# Patient Record
Sex: Female | Born: 1960 | Race: White | Hispanic: No | Smoking: Former smoker
Health system: Southern US, Community
[De-identification: ages and names within clinical notes are randomized; demographics above are authoritative.]

## PROBLEM LIST (undated history)

## (undated) DIAGNOSIS — C50919 Malignant neoplasm of unspecified site of unspecified female breast: Secondary | ICD-10-CM

## (undated) DIAGNOSIS — F419 Anxiety disorder, unspecified: Secondary | ICD-10-CM

## (undated) DIAGNOSIS — R519 Headache, unspecified: Secondary | ICD-10-CM

## (undated) DIAGNOSIS — K219 Gastro-esophageal reflux disease without esophagitis: Secondary | ICD-10-CM

## (undated) DIAGNOSIS — Z923 Personal history of irradiation: Secondary | ICD-10-CM

## (undated) HISTORY — DX: Malignant neoplasm of unspecified site of unspecified female breast: C50.919

## (undated) HISTORY — PX: ROTATOR CUFF REPAIR: SHX139

## (undated) HISTORY — PX: CHOLECYSTECTOMY: SHX55

## (undated) HISTORY — DX: Anxiety disorder, unspecified: F41.9

---

## 1989-09-02 HISTORY — PX: PARTIAL HYSTERECTOMY: SHX80

## 2004-09-02 DIAGNOSIS — C50919 Malignant neoplasm of unspecified site of unspecified female breast: Secondary | ICD-10-CM

## 2004-09-02 HISTORY — PX: BREAST LUMPECTOMY: SHX2

## 2004-09-02 HISTORY — DX: Malignant neoplasm of unspecified site of unspecified female breast: C50.919

## 2004-09-02 HISTORY — PX: MASTECTOMY PARTIAL / LUMPECTOMY: SUR851

## 2005-05-22 DIAGNOSIS — C50919 Malignant neoplasm of unspecified site of unspecified female breast: Secondary | ICD-10-CM | POA: Insufficient documentation

## 2008-09-02 DIAGNOSIS — I671 Cerebral aneurysm, nonruptured: Secondary | ICD-10-CM

## 2008-09-02 HISTORY — DX: Cerebral aneurysm, nonruptured: I67.1

## 2008-09-02 HISTORY — PX: ANEURYSM COILING: SHX5349

## 2008-09-02 HISTORY — PX: OTHER SURGICAL HISTORY: SHX169

## 2012-04-29 ENCOUNTER — Telehealth: Payer: Self-pay | Admitting: *Deleted

## 2012-04-29 NOTE — Telephone Encounter (Signed)
Confirmed 05/22/12 appt w/ pt.  Wandra Feinstein at referring to make her aware.  Mailed before appt letter & packet to pt.  Took paperwork to Med [facility] for chart.

## 2012-05-14 ENCOUNTER — Other Ambulatory Visit: Payer: Self-pay | Admitting: *Deleted

## 2012-05-14 DIAGNOSIS — C50919 Malignant neoplasm of unspecified site of unspecified female breast: Secondary | ICD-10-CM

## 2012-05-22 ENCOUNTER — Ambulatory Visit: Payer: Self-pay

## 2012-05-22 ENCOUNTER — Telehealth: Payer: Self-pay | Admitting: *Deleted

## 2012-05-22 ENCOUNTER — Other Ambulatory Visit (HOSPITAL_BASED_OUTPATIENT_CLINIC_OR_DEPARTMENT_OTHER): Payer: Medicare Other | Admitting: Lab

## 2012-05-22 ENCOUNTER — Ambulatory Visit (HOSPITAL_BASED_OUTPATIENT_CLINIC_OR_DEPARTMENT_OTHER): Payer: Medicare Other | Admitting: Oncology

## 2012-05-22 ENCOUNTER — Encounter: Payer: Self-pay | Admitting: Oncology

## 2012-05-22 VITALS — BP 144/92 | HR 97 | Temp 98.6°F | Resp 20 | Ht 64.0 in | Wt 201.6 lb

## 2012-05-22 DIAGNOSIS — Z170 Estrogen receptor positive status [ER+]: Secondary | ICD-10-CM

## 2012-05-22 DIAGNOSIS — C50919 Malignant neoplasm of unspecified site of unspecified female breast: Secondary | ICD-10-CM

## 2012-05-22 DIAGNOSIS — R413 Other amnesia: Secondary | ICD-10-CM

## 2012-05-22 LAB — COMPREHENSIVE METABOLIC PANEL (CC13)
ALT: 55 U/L (ref 0–55)
AST: 38 U/L — ABNORMAL HIGH (ref 5–34)
Albumin: 4.4 g/dL (ref 3.5–5.0)
Alkaline Phosphatase: 104 U/L (ref 40–150)
BUN: 10 mg/dL (ref 7.0–26.0)
CO2: 27 mEq/L (ref 22–29)
Calcium: 10.3 mg/dL (ref 8.4–10.4)
Chloride: 102 mEq/L (ref 98–107)
Creatinine: 0.9 mg/dL (ref 0.6–1.1)
Glucose: 93 mg/dl (ref 70–99)
Potassium: 4.3 mEq/L (ref 3.5–5.1)
Sodium: 139 mEq/L (ref 136–145)
Total Bilirubin: 0.6 mg/dL (ref 0.20–1.20)
Total Protein: 7.2 g/dL (ref 6.4–8.3)

## 2012-05-22 LAB — CBC WITH DIFFERENTIAL/PLATELET
BASO%: 0.4 % (ref 0.0–2.0)
Basophils Absolute: 0 10*3/uL (ref 0.0–0.1)
EOS%: 2.1 % (ref 0.0–7.0)
Eosinophils Absolute: 0.1 10*3/uL (ref 0.0–0.5)
HCT: 41.1 % (ref 34.8–46.6)
HGB: 13.9 g/dL (ref 11.6–15.9)
LYMPH%: 45 % (ref 14.0–49.7)
MCH: 32.5 pg (ref 25.1–34.0)
MCHC: 33.9 g/dL (ref 31.5–36.0)
MCV: 95.7 fL (ref 79.5–101.0)
MONO#: 0.5 10*3/uL (ref 0.1–0.9)
MONO%: 8.1 % (ref 0.0–14.0)
NEUT#: 2.9 10*3/uL (ref 1.5–6.5)
NEUT%: 44.4 % (ref 38.4–76.8)
Platelets: 225 10*3/uL (ref 145–400)
RBC: 4.29 10*6/uL (ref 3.70–5.45)
RDW: 12.1 % (ref 11.2–14.5)
WBC: 6.6 10*3/uL (ref 3.9–10.3)
lymph#: 3 10*3/uL (ref 0.9–3.3)

## 2012-05-22 NOTE — Patient Instructions (Addendum)
Refer to Breast center for annual mammogram  Refer to Maylon Cos pt with early onset breast cancer  Breast Cancer Survivor Follow-Up Breast cancer begins when cells in the breast divide too rapidly. The extra cells form a lump (tumor). When the cancer is treated, the goal is to get rid of all cancer cells. However, sometimes a few cells survive. These cancer cells can then grow. They become recurrent cancer. This means the cancer comes back after treatment.  Most cases of recurrent breast cancer develop 3 to 5 years after treatment. However, sometimes it comes back just a few months after treatment. Other times, it does not come back until years later. If the cancer comes back in the same area as the first breast cancer, it is called a local recurrence. If the cancer comes back somewhere else in the body, it is called regional recurrence if the site is fairly near the breast or distant recurrence if it is far from the breast. Your caregiver may also use the term metastasize to indicate a cancer that has gone to another part of your body. Treatment is still possible after either kind of recurrence. The cancer can still be controlled.  CAUSES OF RECURRENT CANCER No one knows exactly why breast cancer starts in the first place. Why the cancer comes back after treatment is also not clear. It is known that certain conditions, called risk factors, can make this more likely. They include:  Developing breast cancer for the first time before age 16.   Having breast cancer that involves the lymph nodes. These are small, round pieces of tissue found all over the body. Their job is to help fight infections.   Having a large tumor. Cancer is more apt to come back if the first tumor was bigger than 2 inches (5 cm).   Having certain types of breast cancer, such as:   Inflammatory breast cancer. This rare type grows rapidly and causes the breast to become red and swollen.   A high-grade tumor. The grade of a  tumor indicates how fast it will grow and spread. High-grade tumors grow more quickly than other types.   HER2 cancer. This refers to the tumor's genetic makeup. Tumors that have this type of gene are more likely to come back after treatment.   Having close tumor margins. This refers to the space between the tumor and normal, noncancerous cells. If the space is small, the tumor has a greater chance of coming back.   Having treatment involving a surgery to remove the tumor but not the entire breast (lumpectomy) and no radiation therapy.  CARE AFTER BREAST CANCER Home Monitoring Women who have had breast cancer should continue to examine their breasts every month. The goal is to catch the cancer quickly if it comes back. Many women find it helpful to do so on the same day each month and to mark the calendar as a reminder. Let your caregiver know immediately if you have any signs of recurrent breast cancer. Symptoms will vary, depending on where the cancer recurs. The original type of treatment can also make a difference. Symptoms of local recurrence after a lumpectomy or a recurrence in the opposite breast may include:  A new lump or thickening in the breast.   A change in the way the skin looks on the breast (such as a rash, dimpling, or wrinkling).   Redness or swelling of the breast.   Changes in the nipple (such as being red, puckered, swollen,  or leaking fluid).  Symptoms of a recurrence after a breast removal surgery (mastectomy) may include:  A lump or thickening under the skin.   A thickening around the mastectomy scar.  Symptoms of regional recurrence in the lymph nodes near the breast may include:  A lump under the arm or above the collarbone.   Swelling of the arm.   Pain in the arm, shoulder, or chest.   Numbness in the hand or arm.  Symptoms of distant recurrence may include:  A cough that does not go away.   Trouble breathing or shortness of breath.   Pain in the  bones or the chest. This is pain that lasts or does not respond to rest and medicine.   Headaches.   Sudden vision problems.   Dizziness.   Nausea or vomiting.   Losing weight without trying to.   Persistent abdominal pain.   Changes in bowel movements or blood in the stool.   Yellowing of the skin or eyes (jaundice).   Blood in the urine or bloody vaginal discharge.  Clinical Monitoring  It is helpful to keep a schedule of appointments for needed tests and exams. This includes physical exams, breast exams, exams of the lymph nodes, and general exams.   For the first 3 years after being treated for breast cancer, see your caregiver every 3 to 6 months.   For years 4 and 5 after breast cancer, see your caregiver every 6 to 12 months.   After 5 years, see your caregiver at least once a year.   Regular breast X-rays (mammograms) should continue even if you had a mastectomy.   A mammogram should be done 1 year after the mammogram that first detected breast cancer.   A mammogram should be done every 6 to 12 months after that. Follow your caregiver's advice.   A pelvic exam done by your caregiver checks whether female organs are the normal size and shape. The exam is usually done every year. Ask your caregiver if that schedule is right for you.   Women taking tamoxifen should report any vaginal bleeding immediately to their caregiver. Tamoxifen is often given to women with a certain type of breast cancer. It has been shown to help prevent recurrence.   You will need to decide who your primary caregiver will be.   Most people continue to see their cancer specialist (oncologist) every 3 to 6 months for the first year after cancer treatment.   At some point, you may want to go back to seeing your family caregiver. You would no longer see your oncologist for regular checkups. Many women do this about 1 year after their first diagnosis of breast cancer.   You will still need to be  seen every so often by your oncologist. Ask how often that should be. Coordinate this with your family or primary caregiver.   Think about having genetic counseling. This would provide information on traits that can be passed or inherited from one generation to the next. In some cases, breast cancer runs in families. Tell your caregiver if you:   Are of Ashkenazi Jewish heritage.   Have any family member who has had ovarian cancer.   Have a mother, sister, or daughter who had breast cancer before age 28.   Have 2 or more close relatives who have had breast cancer. This means a mother, sister, daughter, aunt, or grandmother.   Had breast cancer in both breasts.   Have a female relative  who has had breast cancer.   Some tests are not recommended for routine screening. Someone recovering from breast cancer does not need to have these tests if there are no problems. The tests have risks, such as radiation exposure, and can be costly. The risks of these tests are thought to be greater than the benefits:   Blood tests.   Chest X-rays.   Bone scans.   Liver ultrasound.   Computed tomography (CT scan).   Positron emission tomography (PET scan).   Magnetic resonance imaging (MRI scan).  DIAGNOSIS OF RECURRENT CANCER Recurrent breast cancer may be suspected for various reasons. A mammogram may not look normal. You might feel a lump or have other symptoms. Your caregiver may find something unusual during an exam. To be sure, your caregiver will probably order some tests. The tests are needed because there are symptoms or hints of a problem. They could include:  Blood tests, including a test to check how well the liver is working. The liver is a common site for a distant cancer recurrence.   Imaging tests that create pictures of the inside of the body. These tests include:   Chest X-rays to show if the cancer has come back in the lungs.   CT scans to create detailed pictures of various  areas of the body and help find a distant recurrence.   MRI scans to find anything unusual in the breast, chest, or lymph nodes.   Breast ultrasound tests to examine the breasts.   Bone scans to create a picture of your whole skeleton and find cancer in bony areas.   PET scans to create an image of the whole body. PET scans can be used together with CT scans to show more detail.   Biopsy. A small sample of tissue is taken and checked under a microscope. If cancer cells are found, they may be tested to see if they contain the HER2 gene or the hormones estrogen and progesterone. This will help your caregiver decide how to treat the recurrent cancer.  TREATMENT  How recurrent breast cancer is treated depends on where the new cancer is found. The type of treatment that was used for the first breast cancer makes a difference, too. A combination of treatments may be used. Options include:  Surgery.   If the cancer comes back in the breast that was not treated before, you may need a lumpectomy or mastectomy.   If the cancer comes back in the breast that was treated before, you may need a mastectomy.   The lymph nodes under the arm may need to be removed.   Radiation therapy.   For a local recurrence, radiation may be used if it was not used during the first treatment.   For a distance recurrence, radiation is sometimes used.   Chemotherapy.   This may be used before surgery to treat recurrent breast cancer.   This may be used to treat recurrent cancer that cannot be treated with surgery.   This may be used to treat a distant recurrence.   Hormone therapy.   Women with the HER2 gene may be given hormone therapy to attack this gene.  Document Released: 04/17/2011 Document Revised: 08/08/2011 Document Reviewed: 04/17/2011 [facility] Patient Information 2012 [place], [place].

## 2012-05-22 NOTE — Telephone Encounter (Signed)
06-04-2012 genetic counseling starting at 9:00am  [facility] per Victorino Dike she will call the patient and gave the patient her [place] direct number  Gave patient appointment for one year to come back and see lab and md

## 2012-05-22 NOTE — Progress Notes (Signed)
Jaime Solomon [id number] 08/28/1961 51 y.o. 05/22/2012 3:34 PM  CC Micheline Maze PA Dr. Arlyn [facility] [place]  REASON FOR CONSULTATION:  51 year old female with stage I invasive ductal carcinoma patient is establishing her oncologic care in [place].   STAGE:  Stage I Invasive ductal carcinoma   REFERRING PHYSICIAN: Diane Morris PA  HISTORY OF PRESENT ILLNESS:  Jaime Solomon is a 51 y.o. female.  With multiple medical problems. Patient tells me in September 2006 patient had mammogram that was negative. 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 October 2006 she had a lumpectomy and simply [facility]. The tumor was favorable and estrogen receptor positive. She then went on to receive radiation therapy to the right breast from December 2006 to February 2007. She was then started on tamoxifen 20 mg daily. She overall tolerated well.. And eventually it was discontinued in November 2011. Since then patient has gone on to have yearly mammograms performed. And most recently she really located to the [place] area and she is now reestablishing her care. She is without any complaints.   Past Medical History: Past Medical History  Diagnosis Date  . Breast cancer 2006  . Anxiety   . Breast cancer 05/22/2012    Past Surgical History: Past Surgical History  Procedure Date  . Mastectomy partial / lumpectomy 2006    right   . Cholecystectomy   . Angiography 2010    head    Family History: Family History  Problem Relation Age of Onset  . Diabetes Father   . Hypertension Father     Social History History  Substance Use Topics  . Smoking status: Former Smoker    Quit date: 05/22/2005  . Smokeless tobacco: Never Used  . Alcohol Use: 1.2 oz/week    2 Glasses of wine per week    Allergies: Allergies  Allergen Reactions  . Depakote (Divalproex  Sodium)     hallucinations  . Topamax (Topiramate)     "feel like I'm in a fog, cant talk"    Current Medications: Current Outpatient Prescriptions  Medication Sig Dispense Refill  . acyclovir (ZOVIRAX) 200 MG capsule Take by mouth every 4 (four) hours while awake.      Marland Kitchen ALPRAZolam (XANAX) 0.5 MG tablet Take 0.5 mg by mouth at bedtime as needed.      Marland Kitchen buPROPion (WELLBUTRIN XL) 300 MG 24 hr tablet Take 300 mg by mouth daily.      . citalopram (CELEXA) 10 MG tablet Take 10 mg by mouth daily.      . diphenhydrAMINE (SOMINEX) 25 MG tablet Take 50 mg by mouth at bedtime as needed.      Marland Kitchen ibuprofen (ADVIL,MOTRIN) 800 MG tablet Take 800 mg by mouth every 8 (eight) hours as needed.      . nortriptyline (PAMELOR) 50 MG capsule Take 150 mg by mouth at bedtime.      . pantoprazole (PROTONIX) 40 MG tablet Take 40 mg by mouth daily.      . rizatriptan (MAXALT) 10 MG tablet Take 10 mg by mouth as needed. May repeat in 2 hours if needed      . zolpidem (AMBIEN) 10 MG tablet Take 10 mg by mouth at bedtime as needed.      . Fesoterodine Fumarate (TOVIAZ PO) Take 4 mg by mouth.      . levocetirizine (XYZAL) 5 MG tablet Take  5 mg by mouth every evening.        OB/GYN History: menarche at 58, surgical menopause 1994 by hyserectomy, G1P1, first live birth at 18 No HRT  Fertility Discussion: n/a Prior History of Cancer: no previous cancers  Health Maintenance:  Colonoscopy 2011 Bone Density never Last PAP smear unknown  ECOG PERFORMANCE STATUS: 1 - Symptomatic but completely ambulatory  Genetic Counseling/testing: father with bladder cancer, no other breast cancers, no ovarian cancers, endometrial cancers  REVIEW OF SYSTEMS: Patient is a well-developed nourished female she is awake and alert. Very conversant. She does tell me that she has daily chronic headaches daily chronic tinnitus fatigued from lack of sleep due to the tinnitus. She also has daily short-term memory loss. She also has  difficulty with conversing and remembering what she is reading. During driving she is easily is distracted. Patient does have history of angioedema attacks she takes Benadryl and Xanax. 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 She otherwise denies any nausea vomiting fevers chills night sweats she has no chest pains or palpitations no shortness of breath no abdominal pain. She denies any diarrhea or constipation. Remainder of the 14 point review of systems is negative.   PHYSICAL EXAMINATION: Blood pressure 144/92, pulse 97, temperature 98.6 F (37 C), temperature source Oral, resp. rate 20, height 5\' 4"  (1.626 m), weight 201 lb 9.6 oz (91.445 kg). Well-developed and her female in no acute distress. HEENT exam EOMI PERRLA sclerae anicteric no conjunctival pallor oral mucosa is moist neck is supple lungs are clear bilaterally to auscultation and percussion cardiovascular is regular rate rhythm no murmurs gallops or rubs abdomen is soft nontender nondistended bowel sounds are present no hepatosplenomegaly extremities no edema clubbing or cyanosis left breast no masses nipple discharge or skin changes right breast surgical incisional scar from the lumpectomy back in 2006 there is morphine and noted there are no scars no masses no nipple retraction inversion or discharge.  STUDIES/RESULTS: No results found. LABS:    Chemistry   No results found for this basename: NA, K, CL, CO2, BUN, CREATININE, GLU   No results found for this basename: CALCIUM, ALKPHOS, AST, ALT, BILITOT      Lab Results  Component Value Date   WBC 6.6 05/22/2012   HGB 13.9 05/22/2012   HCT 41.1 05/22/2012   MCV 95.7 05/22/2012   PLT 225 05/22/2012    ASSESSMENT    51 year old female with  #1 history of invasive ductal carcinoma of the right breast that was grade 1. Tumor was estrogen receptor positive. She went on to have a right lumpectomy followed by  radiation therapy. She then received 5 years of tamoxifen which she completed in 2011. She has no evidence of recurrent disease.  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  #3 patient does have a significant amount of memory loss and chronic pain.    PLAN:    #1 patient can continue to be seen by [place] on a yearly basis.  #2 she does need a mammogram which will be diagnostic and I will get this set up for her. Patient did carry with her her mammographic films. And I recommended that she take these to the breast Center she has her mammogram performed.  #3 I will see her back  in one year.    Discussion: Patient is being treated per NCCN breast cancer care guidelines appropriate for  stage.Stage I   Thank you so much for allowing me to participate in the care of Janyth Contes. I will continue to follow up the patient with you and assist in her care.  All questions were answered. The patient knows to call the clinic with any problems, questions or concerns. We can certainly see the patient much sooner if necessary.  I spent 60 minutes counseling the patient face to face. The total time spent in the appointment was 60 minutes.   Drue Second, MD Medical/Oncology [facility] [phone] (beeper) [phone] (Office)  05/22/2012, 3:34 PM

## 2012-05-23 LAB — CANCER ANTIGEN 27.29: CA 27.29: 28 U/mL (ref 0–39)

## 2012-06-04 ENCOUNTER — Ambulatory Visit: Payer: Medicare Other | Admitting: Genetic Counselor

## 2012-06-04 ENCOUNTER — Other Ambulatory Visit: Payer: Medicare Other | Admitting: Lab

## 2012-06-04 NOTE — Progress Notes (Signed)
Patient did not show for her genetics appointment

## 2012-07-08 ENCOUNTER — Encounter: Payer: Self-pay | Admitting: *Deleted

## 2012-07-08 NOTE — Progress Notes (Signed)
Mailed after appt letter to pt. 

## 2013-01-22 ENCOUNTER — Other Ambulatory Visit: Payer: Self-pay | Admitting: Obstetrics and Gynecology

## 2013-01-22 ENCOUNTER — Other Ambulatory Visit (HOSPITAL_COMMUNITY)
Admission: RE | Admit: 2013-01-22 | Discharge: 2013-01-22 | Disposition: A | Discharge status: Home / Self Care | Payer: Medicare Other | Source: Ambulatory Visit | Attending: Obstetrics and Gynecology | Admitting: Obstetrics and Gynecology

## 2013-01-22 DIAGNOSIS — Z1151 Encounter for screening for human papillomavirus (HPV): Secondary | ICD-10-CM | POA: Insufficient documentation

## 2013-01-22 DIAGNOSIS — Z01419 Encounter for gynecological examination (general) (routine) without abnormal findings: Secondary | ICD-10-CM | POA: Insufficient documentation

## 2013-02-09 ENCOUNTER — Telehealth: Payer: Self-pay | Admitting: *Deleted

## 2013-02-09 NOTE — Telephone Encounter (Signed)
Left message for pt to return my call so I can schedule a genetic appt.  

## 2013-02-09 NOTE — Telephone Encounter (Signed)
Pt returned my call and I confirmed 04/05/13 genetic appt w/ her.  Took paperwork to Med Rec to scan.

## 2013-03-09 ENCOUNTER — Other Ambulatory Visit: Payer: Self-pay | Admitting: Obstetrics and Gynecology

## 2013-03-09 DIAGNOSIS — Z1231 Encounter for screening mammogram for malignant neoplasm of breast: Secondary | ICD-10-CM

## 2013-03-29 ENCOUNTER — Ambulatory Visit
Admission: RE | Admit: 2013-03-29 | Discharge: 2013-03-29 | Disposition: A | Discharge status: Home / Self Care | Payer: Medicare Other | Source: Ambulatory Visit | Attending: Obstetrics and Gynecology | Admitting: Obstetrics and Gynecology

## 2013-03-29 DIAGNOSIS — Z1231 Encounter for screening mammogram for malignant neoplasm of breast: Secondary | ICD-10-CM

## 2013-04-05 ENCOUNTER — Encounter: Payer: Medicare Other | Admitting: Genetic Counselor

## 2013-04-05 ENCOUNTER — Other Ambulatory Visit: Payer: Medicare Other | Admitting: Lab

## 2013-04-12 ENCOUNTER — Ambulatory Visit (HOSPITAL_COMMUNITY): Admission: RE | Admit: 2013-04-12 | Payer: Medicare Other | Source: Ambulatory Visit | Admitting: Plastic Surgery

## 2013-05-21 ENCOUNTER — Ambulatory Visit: Payer: Medicare Other | Admitting: Oncology

## 2013-05-21 ENCOUNTER — Other Ambulatory Visit: Payer: Self-pay | Admitting: Emergency Medicine

## 2013-05-21 ENCOUNTER — Other Ambulatory Visit: Payer: Medicare Other | Admitting: Lab

## 2013-05-21 DIAGNOSIS — C50919 Malignant neoplasm of unspecified site of unspecified female breast: Secondary | ICD-10-CM

## 2013-05-27 ENCOUNTER — Other Ambulatory Visit: Payer: Self-pay | Admitting: Plastic Surgery

## 2013-05-27 DIAGNOSIS — T8544XD Capsular contracture of breast implant, subsequent encounter: Secondary | ICD-10-CM

## 2013-05-27 NOTE — H&P (Signed)
Jaime Solomon is an 52 y.o. female.   Chief Complaint: Breast Asymmetry HPI: The patient is a 52 yrs old wf here for evaluation of her breast asymmetry. She underwent a right breast lumpectomy with radiation in 2007. She has noticed progressive distortion of the right medial breast over the past several years. She states that it is painful to feel the pull of the contracture. Nothing seems to make it better. She has noticeable asymmetry. The patient is 5 feet 4 inches tall and weighs 195 pounds. She normally wears a 40D bra. She also has a slight grade I ptosis bilaterally.   Past Medical History  Diagnosis Date  . Breast cancer 2006  . Anxiety   . Breast cancer 05/22/2012    Past Surgical History  Procedure Laterality Date  . Mastectomy partial / lumpectomy  2006    right   . Cholecystectomy    . Angiography  2010    head    Family History  Problem Relation Age of Onset  . Diabetes Father   . Hypertension Father    Social History:  reports that she quit smoking about 8 years ago. She has never used smokeless tobacco. She reports that she drinks about 1.2 ounces of alcohol per week. She reports that she does not use illicit drugs.  Allergies:  Allergies  Allergen Reactions  . Depakote [Divalproex Sodium]     hallucinations  . Topamax [Topiramate]     "feel like I'm in a fog, cant talk"     (Not in a hospital admission)  No results found for this or any previous visit (from the past 48 hour(s)). No results found.  Review of Systems  Constitutional: Negative.   HENT: Negative.   Eyes: Negative.   Respiratory: Negative.   Cardiovascular: Negative.   Gastrointestinal: Negative.   Genitourinary: Negative.   Musculoskeletal: Negative.   Skin: Negative.   Neurological: Negative.   Psychiatric/Behavioral: Negative.     There were no vitals taken for this visit. Physical Exam  Constitutional: She appears well-developed and well-nourished.  HENT:  Head: Normocephalic  and atraumatic.  Eyes: Conjunctivae and EOM are normal. Pupils are equal, round, and reactive to light.  Neck: Normal range of motion.  Cardiovascular: Normal rate.   Respiratory: Effort normal.  GI: Soft.  Musculoskeletal: Normal range of motion.  Neurological: She is alert.  Skin: Skin is warm.  Psychiatric: She has a normal mood and affect. Her behavior is normal. Judgment and thought content normal.     Assessment/Plan Recommend release of the contracture and scar tissue with lipofilling to prevent it from occurring again.    SANGER,Dyami Umbach 05/27/2013, 3:29 PM

## 2013-06-10 ENCOUNTER — Encounter (HOSPITAL_BASED_OUTPATIENT_CLINIC_OR_DEPARTMENT_OTHER): Admission: RE | Payer: Self-pay | Source: Ambulatory Visit

## 2013-06-10 ENCOUNTER — Encounter (HOSPITAL_COMMUNITY): Admission: RE | Payer: Self-pay | Source: Ambulatory Visit

## 2013-06-10 ENCOUNTER — Ambulatory Visit (HOSPITAL_BASED_OUTPATIENT_CLINIC_OR_DEPARTMENT_OTHER): Admission: RE | Admit: 2013-06-10 | Payer: Medicare Other | Source: Ambulatory Visit | Admitting: Plastic Surgery

## 2013-06-10 SURGERY — BREAST REDUCTION WITH LIPOSUCTION
Anesthesia: General | Site: Breast | Laterality: Right

## 2013-06-10 SURGERY — RECONSTRUCTION, BREAST
Anesthesia: General | Site: Breast | Laterality: Right

## 2013-09-16 ENCOUNTER — Encounter (HOSPITAL_BASED_OUTPATIENT_CLINIC_OR_DEPARTMENT_OTHER): Admission: RE | Payer: Self-pay | Source: Ambulatory Visit

## 2013-09-16 ENCOUNTER — Ambulatory Visit (HOSPITAL_BASED_OUTPATIENT_CLINIC_OR_DEPARTMENT_OTHER): Admission: RE | Admit: 2013-09-16 | Payer: Medicare Other | Source: Ambulatory Visit | Admitting: Plastic Surgery

## 2013-09-16 SURGERY — REVISION, SCAR
Anesthesia: General | Site: Breast | Laterality: Right

## 2017-09-05 ENCOUNTER — Other Ambulatory Visit: Payer: Self-pay | Admitting: Family Medicine

## 2017-09-05 DIAGNOSIS — Z853 Personal history of malignant neoplasm of breast: Secondary | ICD-10-CM

## 2017-10-24 ENCOUNTER — Ambulatory Visit
Admission: RE | Admit: 2017-10-24 | Discharge: 2017-10-24 | Disposition: A | Discharge status: Home / Self Care | Payer: Medicare Other | Source: Ambulatory Visit | Attending: Family Medicine | Admitting: Family Medicine

## 2017-10-24 DIAGNOSIS — Z853 Personal history of malignant neoplasm of breast: Secondary | ICD-10-CM

## 2017-10-24 HISTORY — DX: Personal history of irradiation: Z92.3

## 2018-07-02 ENCOUNTER — Other Ambulatory Visit: Payer: Self-pay | Admitting: Rehabilitation

## 2018-07-02 DIAGNOSIS — M542 Cervicalgia: Secondary | ICD-10-CM

## 2018-07-04 ENCOUNTER — Other Ambulatory Visit: Payer: Medicare Other

## 2018-07-04 ENCOUNTER — Ambulatory Visit
Admission: RE | Admit: 2018-07-04 | Discharge: 2018-07-04 | Disposition: A | Discharge status: Home / Self Care | Payer: Medicare Other | Source: Ambulatory Visit | Attending: Rehabilitation | Admitting: Rehabilitation

## 2018-07-04 DIAGNOSIS — M542 Cervicalgia: Secondary | ICD-10-CM

## 2018-07-04 MED ORDER — GADOBENATE DIMEGLUMINE 529 MG/ML IV SOLN
15.0000 mL | Freq: Once | INTRAVENOUS | Status: AC | PRN
  Administered 2018-07-04: 15 mL via INTRAVENOUS

## 2018-07-06 ENCOUNTER — Other Ambulatory Visit: Payer: Self-pay | Admitting: Rehabilitation

## 2018-07-06 DIAGNOSIS — M5416 Radiculopathy, lumbar region: Secondary | ICD-10-CM

## 2018-07-17 ENCOUNTER — Ambulatory Visit
Admission: RE | Admit: 2018-07-17 | Discharge: 2018-07-17 | Disposition: A | Discharge status: Home / Self Care | Payer: Medicare Other | Source: Ambulatory Visit | Attending: Rehabilitation | Admitting: Rehabilitation

## 2018-07-17 DIAGNOSIS — M5416 Radiculopathy, lumbar region: Secondary | ICD-10-CM

## 2018-07-17 MED ORDER — GADOBENATE DIMEGLUMINE 529 MG/ML IV SOLN
19.0000 mL | Freq: Once | INTRAVENOUS | Status: AC | PRN
  Administered 2018-07-17: 19 mL via INTRAVENOUS

## 2018-09-02 HISTORY — PX: BACK SURGERY: SHX140

## 2019-06-15 ENCOUNTER — Other Ambulatory Visit: Payer: Self-pay

## 2019-06-15 ENCOUNTER — Emergency Department (HOSPITAL_COMMUNITY): Payer: Medicare Other

## 2019-06-15 ENCOUNTER — Encounter (HOSPITAL_COMMUNITY): Payer: Self-pay

## 2019-06-15 ENCOUNTER — Emergency Department (HOSPITAL_COMMUNITY)
Admission: EM | Admit: 2019-06-15 | Discharge: 2019-06-15 | Disposition: A | Discharge status: Home / Self Care | Payer: Medicare Other | Attending: Emergency Medicine | Admitting: Emergency Medicine

## 2019-06-15 DIAGNOSIS — Z79899 Other long term (current) drug therapy: Secondary | ICD-10-CM | POA: Diagnosis not present

## 2019-06-15 DIAGNOSIS — Z853 Personal history of malignant neoplasm of breast: Secondary | ICD-10-CM | POA: Insufficient documentation

## 2019-06-15 DIAGNOSIS — S42292A Other displaced fracture of upper end of left humerus, initial encounter for closed fracture: Secondary | ICD-10-CM | POA: Insufficient documentation

## 2019-06-15 DIAGNOSIS — S59912A Unspecified injury of left forearm, initial encounter: Secondary | ICD-10-CM | POA: Diagnosis present

## 2019-06-15 DIAGNOSIS — Z87891 Personal history of nicotine dependence: Secondary | ICD-10-CM | POA: Insufficient documentation

## 2019-06-15 DIAGNOSIS — Y999 Unspecified external cause status: Secondary | ICD-10-CM | POA: Insufficient documentation

## 2019-06-15 DIAGNOSIS — Y929 Unspecified place or not applicable: Secondary | ICD-10-CM | POA: Diagnosis not present

## 2019-06-15 DIAGNOSIS — W11XXXA Fall on and from ladder, initial encounter: Secondary | ICD-10-CM | POA: Insufficient documentation

## 2019-06-15 DIAGNOSIS — Y939 Activity, unspecified: Secondary | ICD-10-CM | POA: Insufficient documentation

## 2019-06-15 MED ORDER — OXYCODONE-ACETAMINOPHEN 5-325 MG PO TABS
2.0000 | ORAL_TABLET | Freq: Once | ORAL | Status: AC
  Administered 2019-06-15: 2 via ORAL
  Filled 2019-06-15: qty 2

## 2019-06-15 MED ORDER — ONDANSETRON 4 MG PO TBDP
4.0000 mg | ORAL_TABLET | Freq: Once | ORAL | Status: AC
  Administered 2019-06-15: 4 mg via ORAL
  Filled 2019-06-15: qty 1

## 2019-06-15 MED ORDER — MORPHINE SULFATE (PF) 4 MG/ML IV SOLN
4.0000 mg | Freq: Once | INTRAVENOUS | Status: AC
  Administered 2019-06-15: 4 mg via INTRAMUSCULAR
  Filled 2019-06-15: qty 1

## 2019-06-15 MED ORDER — HYDROCODONE-ACETAMINOPHEN 5-325 MG PO TABS: 1.0000 | ORAL_TABLET | Freq: Three times a day (TID) | ORAL | 0 refills | Status: AC | PRN

## 2019-06-15 MED ORDER — ACETAMINOPHEN ER 650 MG PO TBCR: 650.0000 mg | EXTENDED_RELEASE_TABLET | Freq: Three times a day (TID) | ORAL | 0 refills | Status: DC

## 2019-06-15 NOTE — ED Provider Notes (Addendum)
Patient placed in Quick Look pathway, seen and evaluated   Chief Complaint: fall, left arm injury  HPI:   Jaime Solomon is a 58 y.o. female who presents after she fell from a 3 step stepladder, she reports she landed on her left arm and heard a break she reports swelling and severe pain in the left arm she is pulling it with the elbow bent at 90 degrees and is afraid to move it.  She also reports some pain in the left shoulder.  Reports that she does not have low back pain but had recent surgery in her back in January and wants to ensure that her hardware is in place.  She denies hitting her head, no LOC, no neck pain.  No other injuries from fall.  ROS: + left arm and shoulder pain  Physical Exam:   Gen: No distress  Neuro: Awake and Alert  Skin: Warm    Focused Exam: Left upper arm with some swelling, very tender to palpation there is also mild tenderness over the left shoulder. 2+ radial pulse on my exam. 5/5 grip strength.  No midline cervical spine tenderness no tenderness over the chest or abdomen.  X-rays of the left humerus reviewed, mid shaft left humerus fracture with some displacement, will move back to acute bed for further evaluation   Initiation of care has begun. The patient has been counseled on the process, plan, and necessity for staying for the completion/evaluation, and the remainder of the medical screening examination      [name] 06/15/19 [place], Ankit, MD 06/15/19 1610

## 2019-06-15 NOTE — ED Triage Notes (Signed)
Pt presents with c/o left arm injury. Pt reports she was on a three step ladder and fell, catching herself with her left arm. Pt does have some swelling to the upper left arm. Pt also reports she has a hx of back surgery and would like to verify that she didn't mess up anything in her back with the fall.

## 2019-06-15 NOTE — Discharge Instructions (Signed)
Please follow up with Dr. Erlinda Hong by calling the clinic for appointment on Friday.  Ice the shoulder 4 times a day. Do not mix narcotic medicine and xanax.  We hope you have a swift recovery.

## 2019-06-15 NOTE — ED Provider Notes (Signed)
[facility] DEPT Provider Note   CSN: [id number] Arrival date & time: 06/15/19  1141     History   Chief Complaint Chief Complaint  Patient presents with   Arm Injury    HPI Jaime Solomon is a 58 y.o. female.     HPI 58 year old female comes in a chief complaint of fall.  She had a mechanical fall from a stepstool this morning.  She fell onto her left side.  She is unsure if she had her arms outstretched but she heard a pop.  She has significant discomfort in the upper part of her left arm.  She has mild numbness to her hand.  She is not any blood thinners.  She denies any LOC, headaches, severe neck pain.  She does have mild back pain.  Past Medical History:  Diagnosis Date   Anxiety    Breast cancer ([place]) 2006   Breast cancer ([place]) 05/22/2012   Personal history of radiation therapy     Patient Active Problem List   Diagnosis Date Noted   Breast cancer ([place]) 05/22/2005    Past Surgical History:  Procedure Laterality Date   angiography  2010   head   BREAST LUMPECTOMY Right 2006   CHOLECYSTECTOMY     MASTECTOMY PARTIAL / LUMPECTOMY  2006   right      OB History   No obstetric history on file.      Home Medications    Prior to Admission medications   Medication Sig Start Date End Date Taking? Authorizing Provider  ALPRAZolam Duanne Moron) 0.5 MG tablet Take 0.5 mg by mouth at bedtime as needed for sleep.    Yes [provider]  buPROPion (WELLBUTRIN XL) 300 MG 24 hr tablet Take 300 mg by mouth daily.   Yes [provider]  citalopram (CELEXA) 10 MG tablet Take 10 mg by mouth daily.   Yes [provider]  diphenhydrAMINE (SOMINEX) 25 MG tablet Take 50 mg by mouth at bedtime as needed for sleep.    Yes [provider]  gabapentin (NEURONTIN) 300 MG capsule Take 2 capsules by mouth at bedtime.    Yes [provider]  ibuprofen (ADVIL,MOTRIN) 800 MG tablet Take 800 mg by mouth every 8  (eight) hours as needed for moderate pain.    Yes [provider]  nortriptyline (PAMELOR) 50 MG capsule Take 150 mg by mouth at bedtime.   Yes [provider]  pantoprazole (PROTONIX) 40 MG tablet Take 40 mg by mouth daily.   Yes [provider]  rizatriptan (MAXALT) 10 MG tablet Take 10 mg by mouth as needed for migraine. May repeat in 2 hours if needed    Yes [provider]  acetaminophen (TYLENOL 8 HOUR) 650 MG CR tablet Take 1 tablet (650 mg total) by mouth every 8 (eight) hours. 06/15/19   Varney Biles, MD  HYDROcodone-acetaminophen (NORCO/VICODIN) 5-325 MG tablet Take 1 tablet by mouth every 8 (eight) hours as needed for up to 3 days for severe pain. 06/15/19 06/18/19  Varney Biles, MD    Family History Family History  Problem Relation Age of Onset   Diabetes Father    Hypertension Father     Social History Social History   Tobacco Use   Smoking status: Former Smoker    Quit date: 05/22/2005    Years since quitting: 14.0   Smokeless tobacco: Never Used  Substance Use Topics   Alcohol use: Yes    Alcohol/week: 2.0 standard  drinks    Types: 2 Glasses of wine per week   Drug use: No     Allergies   Depakote [divalproex sodium] and Topamax [topiramate]   Review of Systems Review of Systems  Constitutional: Positive for activity change.  Musculoskeletal: Positive for arthralgias and back pain.  Neurological: Negative for headaches.  Hematological: Does not bruise/bleed easily.     Physical Exam Updated Vital Signs BP (!) 153/93 (BP Location: Right Arm)    Pulse 88    Temp 98.2 F (36.8 C) (Oral)    Resp 20    Ht 5\' 5"  (1.651 m)    Wt 99.8 kg    SpO2 99%    BMI 36.61 kg/m   Physical Exam Vitals signs and nursing note reviewed.  Constitutional:      Appearance: She is well-developed.  HENT:     Head: Normocephalic and atraumatic.  Neck:     Musculoskeletal: Normal range of motion and neck supple.    Cardiovascular:     Rate and Rhythm: Normal rate.  Pulmonary:     Effort: Pulmonary effort is normal.  Abdominal:     General: Bowel sounds are normal.  Musculoskeletal:        General: Swelling and tenderness present.     Comments: L proximal humeral tenderness  Skin:    General: Skin is warm and dry.  Neurological:     Mental Status: She is alert and oriented to person, place, and time.      ED Treatments / Results  Labs (all labs ordered are listed, but only abnormal results are displayed) Labs Reviewed - No data to display  EKG None  Radiology Dg Shoulder Left  Result Date: 06/15/2019 CLINICAL DATA:  Fall, pain EXAM: LEFT HUMERUS - 2+ VIEW; LEFT SHOULDER - 2+ VIEW COMPARISON:  None. FINDINGS: No fracture or dislocation of the left shoulder. There is moderate left acromioclavicular and mild left glenohumeral arthrosis. The partially imaged left chest is unremarkable. There is an oblique, mildly distracted and angulated fracture of the mid left humeral diaphysis. The distal humerus is intact. IMPRESSION: 1.  No fracture or dislocation of the left shoulder. 2. There is an oblique, mildly distracted and angulated fracture of the mid left humeral diaphysis. The distal humerus is intact. Electronically Signed   By: Eddie Candle M.D.   On: 06/15/2019 13:17   Dg Humerus Left  Result Date: 06/15/2019 CLINICAL DATA:  Fall, pain EXAM: LEFT HUMERUS - 2+ VIEW; LEFT SHOULDER - 2+ VIEW COMPARISON:  None. FINDINGS: No fracture or dislocation of the left shoulder. There is moderate left acromioclavicular and mild left glenohumeral arthrosis. The partially imaged left chest is unremarkable. There is an oblique, mildly distracted and angulated fracture of the mid left humeral diaphysis. The distal humerus is intact. IMPRESSION: 1.  No fracture or dislocation of the left shoulder. 2. There is an oblique, mildly distracted and angulated fracture of the mid left humeral diaphysis. The distal  humerus is intact. Electronically Signed   By: Eddie Candle M.D.   On: 06/15/2019 13:17    Procedures Procedures (including critical care time)  Medications Ordered in ED Medications  oxyCODONE-acetaminophen (PERCOCET/ROXICET) 5-325 MG per tablet 2 tablet (2 tablets Oral Given 06/15/19 1226)  ondansetron (ZOFRAN-ODT) disintegrating tablet 4 mg (4 mg Oral Given 06/15/19 1228)  morphine 4 MG/ML injection 4 mg (4 mg Intramuscular Given 06/15/19 1450)     Initial Impression / Assessment and Plan / ED Course  I have reviewed  the triage vital signs and the nursing notes.  Pertinent labs & imaging results that were available during my care of the patient were reviewed by me and considered in my medical decision making (see chart for details).        Pt comes in with cc of fall. She has L proximal humeral fx.  Paresthesia in the dorsum of the hand. 2+ radial pulse.  Spoke with Dr. Erlinda Hong. He recommend coaptation splint and sling with follow up on Friday.  The patient appears reasonably screened and/or stabilized for discharge and I doubt any other medical condition or other [facility] requiring further screening, evaluation, or treatment in the ED at this time prior to discharge.   Results from the ER workup discussed with the patient face to face and all questions answered to the best of my ability. The patient is safe for discharge.   Final Clinical Impressions(s) / ED Diagnoses   Final diagnoses:  Other closed displaced fracture of proximal end of left humerus, initial encounter    ED Discharge Orders         Ordered    HYDROcodone-acetaminophen (NORCO/VICODIN) 5-325 MG tablet  Every 8 hours PRN     06/15/19 1511    acetaminophen (TYLENOL 8 HOUR) 650 MG CR tablet  Every 8 hours     06/15/19 1512           Varney Biles, MD 06/15/19 1523

## 2019-06-17 ENCOUNTER — Other Ambulatory Visit: Payer: Self-pay

## 2019-06-17 ENCOUNTER — Encounter: Payer: Self-pay | Admitting: Orthopaedic Surgery

## 2019-06-17 ENCOUNTER — Ambulatory Visit (INDEPENDENT_AMBULATORY_CARE_PROVIDER_SITE_OTHER): Payer: Medicare Other | Admitting: Orthopaedic Surgery

## 2019-06-17 ENCOUNTER — Other Ambulatory Visit (HOSPITAL_COMMUNITY)
Admission: RE | Admit: 2019-06-17 | Discharge: 2019-06-17 | Disposition: A | Discharge status: Home / Self Care | Payer: Medicare Other | Source: Ambulatory Visit | Attending: Orthopaedic Surgery | Admitting: Orthopaedic Surgery

## 2019-06-17 VITALS — Ht 65.0 in | Wt 220.0 lb

## 2019-06-17 DIAGNOSIS — Z20828 Contact with and (suspected) exposure to other viral communicable diseases: Secondary | ICD-10-CM | POA: Insufficient documentation

## 2019-06-17 DIAGNOSIS — S42322A Displaced transverse fracture of shaft of humerus, left arm, initial encounter for closed fracture: Secondary | ICD-10-CM

## 2019-06-17 DIAGNOSIS — Z01812 Encounter for preprocedural laboratory examination: Secondary | ICD-10-CM | POA: Insufficient documentation

## 2019-06-17 NOTE — Progress Notes (Addendum)
Office Visit Note   Patient: Jaime Solomon           Date of Birth: 1960/10/31           MRN: [id number] Visit Date: 06/17/2019              Requested by: Shirline Frees, MD [address] [place],  [place] [address] PCP: Shirline Frees, MD   Assessment & Plan: Visit Diagnoses: No diagnosis found.  Plan: Impression is transverse left humeral shaft fracture.  The overall alignment is acceptable in the splint.  However with the fact that she lives alone and would have significant difficulty with ADLs I have recommended surgical treatment due to benefits of more reliable and predictable healing and early mobilization and rehab.  Risks and benefits and potential complications were reviewed with the patient today.  She has agreed to the proceed with surgical treatment.  We will plan on this for Monday.  Questions encouraged and answered.  Coaptation splint was reapplied today.  Follow-Up Instructions: Return for 1 week postop visit.   Orders:  No orders of the defined types were placed in this encounter.  No orders of the defined types were placed in this encounter.     Procedures: No procedures performed   Clinical Data: No additional findings.   Subjective: Chief Complaint  Patient presents with  . Left Shoulder - Pain    DOI 06/15/2019    Jaime Solomon is a 58 year old right-hand-dominant female comes in for evaluation of a left humeral shaft fracture that she suffered on 06/15/2019 when she fell from a stool and landed on her left arm.  She was initially evaluated in the ER and x-rays demonstrated a transverse left humeral shaft fracture.  She was placed in a splint and she follows up today.  She states that she has had intense pain with any movement of the left arm.  She denies any numbness and tingling.  She lives alone.   Review of Systems  Constitutional: Negative.   HENT: Negative.   Eyes: Negative.   Respiratory: Negative.   Cardiovascular: Negative.    Endocrine: Negative.   Musculoskeletal: Negative.   Neurological: Negative.   Hematological: Negative.   Psychiatric/Behavioral: Negative.   All other systems reviewed and are negative.    Objective: Vital Signs: Ht 5\' 5"  (1.651 m)   Wt 220 lb (99.8 kg)   BMI 36.61 kg/m   Physical Exam Vitals signs and nursing note reviewed.  Constitutional:      Appearance: She is well-developed.  HENT:     Head: Normocephalic and atraumatic.  Neck:     Musculoskeletal: Neck supple.  Pulmonary:     Effort: Pulmonary effort is normal.  Abdominal:     Palpations: Abdomen is soft.  Skin:    General: Skin is warm.     Capillary Refill: Capillary refill takes less than 2 seconds.  Neurological:     Mental Status: She is alert and oriented to person, place, and time.  Psychiatric:        Behavior: Behavior normal.        Thought Content: Thought content normal.        Judgment: Judgment normal.     Ortho Exam Left arm exam shows mild to moderate swelling.  Obvious deformity.  Skin intact.  Radial nerve palsy.  2+ radial pulse Specialty Comments:  No specialty comments available.  Imaging: No results found.   PMFS History: Patient Active Problem List  Diagnosis Date Noted  . Breast cancer ([place]) 05/22/2005   Past Medical History:  Diagnosis Date  . Anxiety   . Breast cancer ([place]) 2006  . Breast cancer ([place]) 05/22/2012  . Personal history of radiation therapy     Family History  Problem Relation Age of Onset  . Diabetes Father   . Hypertension Father     Past Surgical History:  Procedure Laterality Date  . angiography  2010   head  . BREAST LUMPECTOMY Right 2006  . CHOLECYSTECTOMY    . MASTECTOMY PARTIAL / LUMPECTOMY  2006   right    Social History   Occupational History  . Not on file  Tobacco Use  . Smoking status: Former Smoker    Quit date: 05/22/2005    Years since quitting: 14.0  . Smokeless tobacco: Never Used  Substance and Sexual Activity  . Alcohol  use: Yes    Alcohol/week: 2.0 standard drinks    Types: 2 Glasses of wine per week  . Drug use: No  . Sexual activity: Not Currently

## 2019-06-18 ENCOUNTER — Ambulatory Visit: Payer: Medicare Other | Admitting: Orthopaedic Surgery

## 2019-06-18 ENCOUNTER — Encounter (HOSPITAL_COMMUNITY): Payer: Self-pay | Admitting: Orthopedic Surgery

## 2019-06-18 ENCOUNTER — Ambulatory Visit: Payer: Medicare Other

## 2019-06-18 MED ORDER — TRANEXAMIC ACID-NACL 1000-0.7 MG/100ML-% IV SOLN
1000.0000 mg | INTRAVENOUS | Status: AC
  Administered 2019-06-21: 1000 mg via INTRAVENOUS
  Filled 2019-06-18: qty 100

## 2019-06-18 MED ORDER — TRANEXAMIC ACID 1000 MG/10ML IV SOLN
2000.0000 mg | INTRAVENOUS | Status: DC
  Filled 2019-06-18: qty 20

## 2019-06-18 MED ORDER — CEFAZOLIN SODIUM-DEXTROSE 2-4 GM/100ML-% IV SOLN
2.0000 g | INTRAVENOUS | Status: AC
  Administered 2019-06-21: 2 g via INTRAVENOUS
  Filled 2019-06-18: qty 100

## 2019-06-18 NOTE — Progress Notes (Signed)
SDW pre-op phone call  Patient denies SOB, chest pain or fevers.   Does not see a Cardiologist.  Medication: Tylenol CR and MaxAlt if needed morning of surgery. Told patient to stop all NSAIDs, aspirin, and supplements.  ERAS: Clear Liquids until 1000. Patient educated on acceptable clear liquids. Patient verbalized understanding  Covid test pending. Reminded patient about staying quarantined until morning of surgery. Patient verbalized understanding.  All questions were addressed.

## 2019-06-19 LAB — NOVEL CORONAVIRUS, NAA (HOSP ORDER, SEND-OUT TO REF LAB; TAT 18-24 HRS): SARS-CoV-2, NAA: NOT DETECTED

## 2019-06-21 ENCOUNTER — Encounter (HOSPITAL_COMMUNITY)
Admission: RE | Disposition: A | Discharge status: Home / Self Care | Payer: Self-pay | Source: Home / Self Care | Attending: Orthopaedic Surgery

## 2019-06-21 ENCOUNTER — Ambulatory Visit (HOSPITAL_COMMUNITY): Payer: Medicare Other

## 2019-06-21 ENCOUNTER — Ambulatory Visit (HOSPITAL_COMMUNITY): Payer: Medicare Other | Admitting: Anesthesiology

## 2019-06-21 ENCOUNTER — Observation Stay (HOSPITAL_COMMUNITY)
Admission: RE | Admit: 2019-06-21 | Discharge: 2019-06-22 | Disposition: A | Discharge status: Home / Self Care | Payer: Medicare Other | Attending: Orthopaedic Surgery | Admitting: Orthopaedic Surgery

## 2019-06-21 ENCOUNTER — Other Ambulatory Visit: Payer: Self-pay

## 2019-06-21 ENCOUNTER — Encounter (HOSPITAL_COMMUNITY): Payer: Self-pay | Admitting: Surgery

## 2019-06-21 DIAGNOSIS — Z923 Personal history of irradiation: Secondary | ICD-10-CM | POA: Diagnosis not present

## 2019-06-21 DIAGNOSIS — Z79899 Other long term (current) drug therapy: Secondary | ICD-10-CM | POA: Diagnosis not present

## 2019-06-21 DIAGNOSIS — S42302A Unspecified fracture of shaft of humerus, left arm, initial encounter for closed fracture: Secondary | ICD-10-CM | POA: Diagnosis present

## 2019-06-21 DIAGNOSIS — Z9889 Other specified postprocedural states: Secondary | ICD-10-CM

## 2019-06-21 DIAGNOSIS — S42322A Displaced transverse fracture of shaft of humerus, left arm, initial encounter for closed fracture: Secondary | ICD-10-CM | POA: Diagnosis not present

## 2019-06-21 DIAGNOSIS — X58XXXA Exposure to other specified factors, initial encounter: Secondary | ICD-10-CM | POA: Insufficient documentation

## 2019-06-21 DIAGNOSIS — F419 Anxiety disorder, unspecified: Secondary | ICD-10-CM | POA: Insufficient documentation

## 2019-06-21 DIAGNOSIS — K219 Gastro-esophageal reflux disease without esophagitis: Secondary | ICD-10-CM | POA: Diagnosis not present

## 2019-06-21 DIAGNOSIS — R109 Unspecified abdominal pain: Secondary | ICD-10-CM

## 2019-06-21 DIAGNOSIS — R5082 Postprocedural fever: Secondary | ICD-10-CM

## 2019-06-21 DIAGNOSIS — Z888 Allergy status to other drugs, medicaments and biological substances status: Secondary | ICD-10-CM | POA: Insufficient documentation

## 2019-06-21 DIAGNOSIS — G8918 Other acute postprocedural pain: Secondary | ICD-10-CM

## 2019-06-21 DIAGNOSIS — Z853 Personal history of malignant neoplasm of breast: Secondary | ICD-10-CM | POA: Diagnosis not present

## 2019-06-21 DIAGNOSIS — Z87891 Personal history of nicotine dependence: Secondary | ICD-10-CM | POA: Diagnosis not present

## 2019-06-21 HISTORY — DX: Headache, unspecified: R51.9

## 2019-06-21 HISTORY — PX: ORIF HUMERUS FRACTURE: SHX2126

## 2019-06-21 HISTORY — DX: Gastro-esophageal reflux disease without esophagitis: K21.9

## 2019-06-21 LAB — BASIC METABOLIC PANEL
Anion gap: 9 (ref 5–15)
BUN: 11 mg/dL (ref 6–20)
CO2: 23 mmol/L (ref 22–32)
Calcium: 9.3 mg/dL (ref 8.9–10.3)
Chloride: 103 mmol/L (ref 98–111)
Creatinine, Ser: 0.79 mg/dL (ref 0.44–1.00)
GFR calc Af Amer: >60 mL/min (ref >60)
GFR calc non Af Amer: >60 mL/min (ref >60)
Glucose, Bld: 105 mg/dL — ABNORMAL HIGH (ref 70–99)
Potassium: 3.9 mmol/L (ref 3.5–5.1)
Sodium: 135 mmol/L (ref 135–145)

## 2019-06-21 SURGERY — OPEN REDUCTION INTERNAL FIXATION (ORIF) HUMERAL SHAFT FRACTURE
Anesthesia: Regional | Site: Arm Upper | Laterality: Left

## 2019-06-21 MED ORDER — 0.9 % SODIUM CHLORIDE (POUR BTL) OPTIME
TOPICAL | Status: DC | PRN
  Administered 2019-06-21: 3000 mL

## 2019-06-21 MED ORDER — CHLORHEXIDINE GLUCONATE 4 % EX LIQD: 60.0000 mL | Freq: Once | CUTANEOUS | Status: DC

## 2019-06-21 MED ORDER — MIDAZOLAM HCL 5 MG/5ML IJ SOLN
INTRAMUSCULAR | Status: DC | PRN
  Administered 2019-06-21: 2 mg via INTRAVENOUS

## 2019-06-21 MED ORDER — NORTRIPTYLINE HCL 25 MG PO CAPS
150.0000 mg | ORAL_CAPSULE | Freq: Every day | ORAL | Status: DC
  Administered 2019-06-22: 150 mg via ORAL
  Filled 2019-06-21 (×3): qty 6

## 2019-06-21 MED ORDER — OXYCODONE HCL 5 MG PO TABS: 5.0000 mg | ORAL_TABLET | Freq: Three times a day (TID) | ORAL | 0 refills | Status: DC | PRN

## 2019-06-21 MED ORDER — MAGNESIUM CITRATE PO SOLN: 1.0000 | Freq: Once | ORAL | Status: DC | PRN

## 2019-06-21 MED ORDER — MIDAZOLAM HCL 2 MG/2ML IJ SOLN
INTRAMUSCULAR | Status: AC
  Filled 2019-06-21: qty 2

## 2019-06-21 MED ORDER — LIDOCAINE 2% (20 MG/ML) 5 ML SYRINGE
INTRAMUSCULAR | Status: AC
  Filled 2019-06-21: qty 10

## 2019-06-21 MED ORDER — DIPHENHYDRAMINE HCL 12.5 MG/5ML PO ELIX: 25.0000 mg | ORAL_SOLUTION | ORAL | Status: DC | PRN

## 2019-06-21 MED ORDER — PROPOFOL 10 MG/ML IV BOLUS
INTRAVENOUS | Status: DC | PRN
  Administered 2019-06-21: 150 mg via INTRAVENOUS

## 2019-06-21 MED ORDER — OXYCODONE HCL 5 MG/5ML PO SOLN: 5.0000 mg | Freq: Once | ORAL | Status: DC | PRN

## 2019-06-21 MED ORDER — CITALOPRAM HYDROBROMIDE 20 MG PO TABS
10.0000 mg | ORAL_TABLET | Freq: Every day | ORAL | Status: DC
  Administered 2019-06-21: 10 mg via ORAL
  Filled 2019-06-21: qty 1

## 2019-06-21 MED ORDER — ROCURONIUM BROMIDE 10 MG/ML (PF) SYRINGE
PREFILLED_SYRINGE | INTRAVENOUS | Status: DC | PRN
  Administered 2019-06-21: 60 mg via INTRAVENOUS
  Administered 2019-06-21: 10 mg via INTRAVENOUS

## 2019-06-21 MED ORDER — DEXAMETHASONE SODIUM PHOSPHATE 10 MG/ML IJ SOLN
INTRAMUSCULAR | Status: DC | PRN
  Administered 2019-06-21: 8 mg via INTRAVENOUS

## 2019-06-21 MED ORDER — HYDROMORPHONE HCL 1 MG/ML IJ SOLN
0.5000 mg | INTRAMUSCULAR | Status: DC | PRN
  Administered 2019-06-21 – 2019-06-22 (×2): 1 mg via INTRAVENOUS
  Filled 2019-06-21 (×2): qty 1

## 2019-06-21 MED ORDER — SODIUM CHLORIDE 0.9 % IV SOLN
INTRAVENOUS | Status: DC
  Administered 2019-06-21 – 2019-06-22 (×2): via INTRAVENOUS

## 2019-06-21 MED ORDER — SODIUM CHLORIDE 0.9 % IV SOLN
INTRAVENOUS | Status: DC | PRN
  Administered 2019-06-21: 20 ug/min via INTRAVENOUS

## 2019-06-21 MED ORDER — FENTANYL CITRATE (PF) 100 MCG/2ML IJ SOLN
50.0000 ug | Freq: Once | INTRAMUSCULAR | Status: AC
  Administered 2019-06-21: 12:00:00 50 ug via INTRAVENOUS

## 2019-06-21 MED ORDER — KETOROLAC TROMETHAMINE 15 MG/ML IJ SOLN
15.0000 mg | Freq: Four times a day (QID) | INTRAMUSCULAR | Status: DC
  Administered 2019-06-21 – 2019-06-22 (×3): 15 mg via INTRAVENOUS
  Filled 2019-06-21 (×4): qty 1

## 2019-06-21 MED ORDER — ACETAMINOPHEN 325 MG PO TABS: 325.0000 mg | ORAL_TABLET | Freq: Four times a day (QID) | ORAL | Status: DC | PRN

## 2019-06-21 MED ORDER — VANCOMYCIN HCL 1000 MG IV SOLR
INTRAVENOUS | Status: AC
  Filled 2019-06-21: qty 1000

## 2019-06-21 MED ORDER — SORBITOL 70 % SOLN: 30.0000 mL | Freq: Every day | Status: DC | PRN

## 2019-06-21 MED ORDER — OXYCODONE HCL 5 MG PO TABS: 5.0000 mg | ORAL_TABLET | Freq: Once | ORAL | Status: DC | PRN

## 2019-06-21 MED ORDER — CALCIUM CARBONATE-VITAMIN D 500-200 MG-UNIT PO TABS: 1.0000 | ORAL_TABLET | Freq: Three times a day (TID) | ORAL | 6 refills | Status: DC

## 2019-06-21 MED ORDER — OXYCODONE HCL 5 MG PO TABS
10.0000 mg | ORAL_TABLET | ORAL | Status: DC | PRN
  Administered 2019-06-22: 10 mg via ORAL
  Filled 2019-06-21: qty 2

## 2019-06-21 MED ORDER — KETOROLAC TROMETHAMINE 10 MG PO TABS: 10.0000 mg | ORAL_TABLET | Freq: Two times a day (BID) | ORAL | 0 refills | Status: DC | PRN

## 2019-06-21 MED ORDER — ONDANSETRON HCL 4 MG/2ML IJ SOLN
INTRAMUSCULAR | Status: AC
  Filled 2019-06-21: qty 6

## 2019-06-21 MED ORDER — MIDAZOLAM HCL 2 MG/2ML IJ SOLN
2.0000 mg | Freq: Once | INTRAMUSCULAR | Status: AC
  Administered 2019-06-21: 12:00:00 2 mg via INTRAVENOUS

## 2019-06-21 MED ORDER — FENTANYL CITRATE (PF) 100 MCG/2ML IJ SOLN: 25.0000 ug | INTRAMUSCULAR | Status: DC | PRN

## 2019-06-21 MED ORDER — GABAPENTIN 300 MG PO CAPS
600.0000 mg | ORAL_CAPSULE | Freq: Every day | ORAL | Status: DC
  Administered 2019-06-21: 600 mg via ORAL
  Filled 2019-06-21: qty 2

## 2019-06-21 MED ORDER — OXYCODONE HCL 5 MG PO TABS: 5.0000 mg | ORAL_TABLET | ORAL | Status: DC | PRN

## 2019-06-21 MED ORDER — METHOCARBAMOL 1000 MG/10ML IJ SOLN
500.0000 mg | Freq: Four times a day (QID) | INTRAVENOUS | Status: DC | PRN
  Filled 2019-06-21: qty 5

## 2019-06-21 MED ORDER — ONDANSETRON HCL 4 MG PO TABS: 4.0000 mg | ORAL_TABLET | Freq: Four times a day (QID) | ORAL | Status: DC | PRN

## 2019-06-21 MED ORDER — SUGAMMADEX SODIUM 200 MG/2ML IV SOLN
INTRAVENOUS | Status: DC | PRN
  Administered 2019-06-21 (×2): 100 mg via INTRAVENOUS

## 2019-06-21 MED ORDER — ROCURONIUM BROMIDE 10 MG/ML (PF) SYRINGE
PREFILLED_SYRINGE | INTRAVENOUS | Status: AC
  Filled 2019-06-21: qty 10

## 2019-06-21 MED ORDER — DOCUSATE SODIUM 100 MG PO CAPS
100.0000 mg | ORAL_CAPSULE | Freq: Two times a day (BID) | ORAL | Status: DC
  Filled 2019-06-21: qty 1

## 2019-06-21 MED ORDER — LIDOCAINE 2% (20 MG/ML) 5 ML SYRINGE
INTRAMUSCULAR | Status: DC | PRN
  Administered 2019-06-21: 60 mg via INTRAVENOUS

## 2019-06-21 MED ORDER — FENTANYL CITRATE (PF) 100 MCG/2ML IJ SOLN
INTRAMUSCULAR | Status: AC
  Administered 2019-06-21: 50 ug via INTRAVENOUS
  Filled 2019-06-21: qty 2

## 2019-06-21 MED ORDER — ZINC SULFATE 220 (50 ZN) MG PO CAPS: 220.0000 mg | ORAL_CAPSULE | Freq: Every day | ORAL | 0 refills | Status: DC

## 2019-06-21 MED ORDER — FENTANYL CITRATE (PF) 100 MCG/2ML IJ SOLN
INTRAMUSCULAR | Status: DC | PRN
  Administered 2019-06-21 (×2): 50 ug via INTRAVENOUS

## 2019-06-21 MED ORDER — FENTANYL CITRATE (PF) 250 MCG/5ML IJ SOLN
INTRAMUSCULAR | Status: AC
  Filled 2019-06-21: qty 5

## 2019-06-21 MED ORDER — ALPRAZOLAM 0.5 MG PO TABS
0.5000 mg | ORAL_TABLET | Freq: Every evening | ORAL | Status: DC | PRN
  Administered 2019-06-21: 21:00:00 0.5 mg via ORAL
  Filled 2019-06-21: qty 1

## 2019-06-21 MED ORDER — MIDAZOLAM HCL 2 MG/2ML IJ SOLN
INTRAMUSCULAR | Status: AC
  Administered 2019-06-21: 2 mg via INTRAVENOUS
  Filled 2019-06-21: qty 2

## 2019-06-21 MED ORDER — METOCLOPRAMIDE HCL 5 MG/ML IJ SOLN: 5.0000 mg | Freq: Three times a day (TID) | INTRAMUSCULAR | Status: DC | PRN

## 2019-06-21 MED ORDER — ONDANSETRON HCL 4 MG/2ML IJ SOLN: 4.0000 mg | Freq: Four times a day (QID) | INTRAMUSCULAR | Status: DC | PRN

## 2019-06-21 MED ORDER — VANCOMYCIN HCL 1000 MG IV SOLR
INTRAVENOUS | Status: DC | PRN
  Administered 2019-06-21: 1000 mg

## 2019-06-21 MED ORDER — METOCLOPRAMIDE HCL 5 MG PO TABS: 5.0000 mg | ORAL_TABLET | Freq: Three times a day (TID) | ORAL | Status: DC | PRN

## 2019-06-21 MED ORDER — ONDANSETRON HCL 4 MG PO TABS: 4.0000 mg | ORAL_TABLET | Freq: Three times a day (TID) | ORAL | 0 refills | Status: DC | PRN

## 2019-06-21 MED ORDER — CEFAZOLIN SODIUM-DEXTROSE 2-4 GM/100ML-% IV SOLN
2.0000 g | Freq: Four times a day (QID) | INTRAVENOUS | Status: AC
  Administered 2019-06-21 – 2019-06-22 (×3): 2 g via INTRAVENOUS
  Filled 2019-06-21 (×3): qty 100

## 2019-06-21 MED ORDER — LACTATED RINGERS IV SOLN
INTRAVENOUS | Status: DC
  Administered 2019-06-21 (×2): via INTRAVENOUS

## 2019-06-21 MED ORDER — METHOCARBAMOL 500 MG PO TABS
500.0000 mg | ORAL_TABLET | Freq: Four times a day (QID) | ORAL | Status: DC | PRN
  Administered 2019-06-22: 500 mg via ORAL
  Filled 2019-06-21: qty 1

## 2019-06-21 MED ORDER — PANTOPRAZOLE SODIUM 40 MG PO TBEC
40.0000 mg | DELAYED_RELEASE_TABLET | Freq: Every day | ORAL | Status: DC
  Administered 2019-06-21 – 2019-06-22 (×2): 40 mg via ORAL
  Filled 2019-06-21 (×2): qty 1

## 2019-06-21 MED ORDER — ACETAMINOPHEN 500 MG PO TABS
1000.0000 mg | ORAL_TABLET | Freq: Four times a day (QID) | ORAL | Status: AC
  Administered 2019-06-21 – 2019-06-22 (×4): 1000 mg via ORAL
  Filled 2019-06-21 (×4): qty 2

## 2019-06-21 MED ORDER — BUPIVACAINE-EPINEPHRINE (PF) 0.5% -1:200000 IJ SOLN
INTRAMUSCULAR | Status: DC | PRN
  Administered 2019-06-21: 30 mL via PERINEURAL

## 2019-06-21 MED ORDER — ONDANSETRON HCL 4 MG/2ML IJ SOLN
INTRAMUSCULAR | Status: DC | PRN
  Administered 2019-06-21: 4 mg via INTRAVENOUS

## 2019-06-21 MED ORDER — BUPROPION HCL ER (XL) 150 MG PO TB24
300.0000 mg | ORAL_TABLET | Freq: Every day | ORAL | Status: DC
  Administered 2019-06-21: 300 mg via ORAL
  Filled 2019-06-21: qty 2

## 2019-06-21 MED ORDER — DEXAMETHASONE SODIUM PHOSPHATE 10 MG/ML IJ SOLN
INTRAMUSCULAR | Status: AC
  Filled 2019-06-21: qty 2

## 2019-06-21 MED ORDER — PROMETHAZINE HCL 25 MG/ML IJ SOLN: 6.2500 mg | INTRAMUSCULAR | Status: DC | PRN

## 2019-06-21 MED ORDER — POLYETHYLENE GLYCOL 3350 17 G PO PACK: 17.0000 g | PACK | Freq: Every day | ORAL | Status: DC | PRN

## 2019-06-21 SURGICAL SUPPLY — 58 items
BIT DRILL OVR 3.5AO QC SHRT SM (DRILL) ×1 IMPLANT
BIT DRILL QC 2.5MM SHRT EVO SM (DRILL) ×1 IMPLANT
BLADE SURG 10 STRL SS (BLADE) IMPLANT
BNDG ESMARK 4X9 LF (GAUZE/BANDAGES/DRESSINGS) IMPLANT
CLSR STERI-STRIP ANTIMIC 1/2X4 (GAUZE/BANDAGES/DRESSINGS) ×2 IMPLANT
COVER SURGICAL LIGHT HANDLE (MISCELLANEOUS) ×2 IMPLANT
COVER WAND RF STERILE (DRAPES) ×2 IMPLANT
CUFF TOURN SGL QUICK 18X4 (TOURNIQUET CUFF) ×2 IMPLANT
CUFF TOURN SGL QUICK 24 (TOURNIQUET CUFF)
CUFF TRNQT CYL 24X4X16.5-23 (TOURNIQUET CUFF) IMPLANT
DRAPE C-ARM 42X72 X-RAY (DRAPES) ×2 IMPLANT
DRAPE IMP U-DRAPE 54X76 (DRAPES) ×2 IMPLANT
DRAPE INCISE IOBAN 66X45 STRL (DRAPES) ×2 IMPLANT
DRAPE U-SHAPE 47X51 STRL (DRAPES) ×2 IMPLANT
DRILL OVER 3.5 AO QC SHORT SM (DRILL) ×2
DRILL QC 2.5MM SHORT EVOS SM (DRILL) ×2
DRSG TEGADERM 4X4.75 (GAUZE/BANDAGES/DRESSINGS) ×2 IMPLANT
ELECT CAUTERY BLADE 6.4 (BLADE) ×2 IMPLANT
ELECT REM PT RETURN 9FT ADLT (ELECTROSURGICAL) ×2
ELECTRODE REM PT RTRN 9FT ADLT (ELECTROSURGICAL) ×1 IMPLANT
FACESHIELD WRAPAROUND (MASK) ×2 IMPLANT
GAUZE SPONGE 4X4 12PLY STRL (GAUZE/BANDAGES/DRESSINGS) ×2 IMPLANT
GAUZE XEROFORM 5X9 LF (GAUZE/BANDAGES/DRESSINGS) ×2 IMPLANT
GLOVE BIOGEL PI IND STRL 7.0 (GLOVE) ×1 IMPLANT
GLOVE BIOGEL PI INDICATOR 7.0 (GLOVE) ×1
GLOVE ECLIPSE 7.0 STRL STRAW (GLOVE) ×2 IMPLANT
GLOVE SKINSENSE NS SZ7.5 (GLOVE) ×2
GLOVE SKINSENSE STRL SZ7.5 (GLOVE) ×2 IMPLANT
GOWN STRL REIN XL XLG (GOWN DISPOSABLE) ×6 IMPLANT
KIT BASIN OR (CUSTOM PROCEDURE TRAY) ×2 IMPLANT
KIT TURNOVER KIT B (KITS) ×2 IMPLANT
MANIFOLD NEPTUNE II (INSTRUMENTS) ×2 IMPLANT
NS IRRIG 1000ML POUR BTL (IV SOLUTION) ×2 IMPLANT
PACK SHOULDER (CUSTOM PROCEDURE TRAY) ×2 IMPLANT
PACK UNIVERSAL I (CUSTOM PROCEDURE TRAY) ×2 IMPLANT
PAD ARMBOARD 7.5X6 YLW CONV (MISCELLANEOUS) ×4 IMPLANT
PAD CAST 4YDX4 CTTN HI CHSV (CAST SUPPLIES) ×1 IMPLANT
PADDING CAST COTTON 4X4 STRL (CAST SUPPLIES) ×1
SCREW 4.5X18 (Screw) ×6 IMPLANT
SCREW 4.5X20 (Screw) ×10 IMPLANT
SCREW CORT EVOS ST 3.5X28 (Screw) ×2 IMPLANT
SCREW NLOCK COMP F/4.5X142 7H (Screw) ×2 IMPLANT
SLING ARM IMMOBILIZER LRG (SOFTGOODS) ×2 IMPLANT
SPONGE LAP 18X18 RF (DISPOSABLE) ×2 IMPLANT
STAPLER VISISTAT 35W (STAPLE) IMPLANT
SUCTION FRAZIER HANDLE 10FR (MISCELLANEOUS)
SUCTION TUBE FRAZIER 10FR DISP (MISCELLANEOUS) IMPLANT
SUT ETHILON 3 0 PS 1 (SUTURE) ×4 IMPLANT
SUT MNCRL AB 4-0 PS2 18 (SUTURE) ×2 IMPLANT
SUT VIC AB 0 CT1 27 (SUTURE) ×3
SUT VIC AB 0 CT1 27XBRD ANBCTR (SUTURE) ×3 IMPLANT
SUT VIC AB 2-0 CT1 27 (SUTURE) ×3
SUT VIC AB 2-0 CT1 TAPERPNT 27 (SUTURE) ×3 IMPLANT
SYR CONTROL 10ML LL (SYRINGE) IMPLANT
TOWEL GREEN STERILE (TOWEL DISPOSABLE) ×2 IMPLANT
TOWEL GREEN STERILE FF (TOWEL DISPOSABLE) IMPLANT
UNDERPAD 30X30 (UNDERPADS AND DIAPERS) ×2 IMPLANT
WATER STERILE IRR 1000ML POUR (IV SOLUTION) ×2 IMPLANT

## 2019-06-21 NOTE — Anesthesia Procedure Notes (Addendum)
Procedure Name: Intubation Date/Time: 06/21/2019 1:12 PM Performed by: Audry Pili, MD Pre-anesthesia Checklist: Patient identified, Emergency Drugs available, Suction available and Patient being monitored Patient Re-evaluated:Patient Re-evaluated prior to induction Oxygen Delivery Method: Circle System Utilized Preoxygenation: Pre-oxygenation with 100% oxygen Induction Type: IV induction Ventilation: Mask ventilation without difficulty Laryngoscope Size: Miller and 2 Grade View: Grade II Tube type: Oral Number of attempts: 2 (First attempt by CRNA, grade 3 view. Second attempt by Dr. Fransisco Beau, grade 2a view.) Airway Equipment and Method: Stylet and Oral airway Placement Confirmation: ETT inserted through vocal cords under direct vision,  positive ETCO2 and breath sounds checked- equal and bilateral Secured at: 22 cm Tube secured with: Tape Dental Injury: Teeth and Oropharynx as per pre-operative assessment  Comments: No change in dentition from pre-procedure

## 2019-06-21 NOTE — Anesthesia Postprocedure Evaluation (Signed)
Anesthesia Post Note  Patient: Jaime Solomon  Procedure(s) Performed: OPEN REDUCTION INTERNAL FIXATION (ORIF) LEFT HUMERAL SHAFT FRACTURE (Left Arm Upper)     Patient location during evaluation: PACU Anesthesia Type: General Level of consciousness: awake and alert Pain management: pain level controlled Vital Signs Assessment: post-procedure vital signs reviewed and stable Respiratory status: spontaneous breathing, nonlabored ventilation and respiratory function stable Cardiovascular status: blood pressure returned to baseline and stable Postop Assessment: no apparent nausea or vomiting Anesthetic complications: no    Last Vitals:  Vitals:   06/21/19 1723 06/21/19 1738  BP: 113/73 109/67  Pulse: 88 92  Resp:    Temp:    SpO2: 91% 94%    Last Pain:  Vitals:   06/21/19 1600  TempSrc:   PainSc: 0-No pain                 Audry Pili

## 2019-06-21 NOTE — Transfer of Care (Signed)
Immediate Anesthesia Transfer of Care Note  Patient: Jaime Solomon  Procedure(s) Performed: OPEN REDUCTION INTERNAL FIXATION (ORIF) LEFT HUMERAL SHAFT FRACTURE (Left Arm Upper)  Patient Location: PACU  Anesthesia Type:GA combined with regional for post-op pain  Level of Consciousness: awake, alert , oriented and drowsy  Airway & Oxygen Therapy: Patient Spontanous Breathing and Patient connected to nasal cannula oxygen  Post-op Assessment: Report given to RN and Post -op Vital signs reviewed and stable  Post vital signs: Reviewed and stable  Last Vitals:  Vitals Value Taken Time  BP 128/75 06/21/19 1508  Temp    Pulse 83 06/21/19 1511  Resp 18 06/21/19 1511  SpO2 95 % 06/21/19 1511  Vitals shown include unvalidated device data.  Last Pain:  Vitals:   06/21/19 1115  TempSrc: Oral  PainSc:       Patients Stated Pain Goal: 2 ([id number])  Complications: No apparent anesthesia complications

## 2019-06-21 NOTE — Op Note (Signed)
   Date of Surgery: 06/21/2019  INDICATIONS: Jaime Solomon is a 58 y.o.-year-old female with a left humeral shaft fracture;  The patient did consent to the procedure after discussion of the risks and benefits.  PREOPERATIVE DIAGNOSIS: Left humeral shaft fracture  POSTOPERATIVE DIAGNOSIS: Same.  PROCEDURE: Open reduction internal fixation left humeral shaft fracture  SURGEON: N. Eduard Roux, M.D.  ASSIST: Ciro Backer [place], [place]; necessary for the timely completion of procedure and due to complexity of procedure.  ANESTHESIA:  general, regional block  IV FLUIDS AND URINE: See anesthesia.  ESTIMATED BLOOD LOSS: Minimal mL.  IMPLANTS: [employer] 4.5 mm DCP plate 7 holes  DRAINS: None  COMPLICATIONS: see description of procedure.  DESCRIPTION OF PROCEDURE: The patient was brought to the operating room and placed supine on the operating table.  The patient had been signed prior to the procedure and this was documented. The patient had the anesthesia placed by the anesthesiologist.  A time-out was performed to confirm that this was the correct patient, site, side and location. The patient did receive antibiotics prior to the incision and was re-dosed during the procedure as needed at indicated intervals.  The patient had the operative extremity prepped and draped in the standard surgical fashion.    An incision was made on the anterolateral aspect of the upper arm.  An anterolateral approach to the humerus was utilized.  The biceps muscle belly was retracted medially off of the brachialis muscle.  The brachialis muscle belly was then split bluntly along its fibers by hand.  This brought [place] down onto the humeral shaft.  The brachialis muscle belly was elevated off of the anterior aspect of the humeral shaft both distally and proximally.  A small portion of the insertion of the deltoid muscle was released for exposure.  The fracture was then encountered.  There is no evidence of entrapped  radial nerve.  Organized hematoma was removed from the fracture site and this was then reduced with a large clamp.  The reduction was confirmed under fluoroscopy.  There was an oblique component to the fracture that was amenable to lag fixation.  A 3.5 mm lag screw was placed across the fracture with excellent compression and purchase.  The clamp was then removed.  We then placed a 4.5 mm nonlocking 7-hole dynamic compression plate on the anterior aspect of the humerus using fluoroscopic guidance.  We then placed 3 nonlocking screws above and below the fracture each with excellent purchase.  Final x-rays were taken.  The surgical wound was then thoroughly irrigated with normal saline.  1 g of vancomycin powder was placed in the surgical wound.  A layered closure was then performed.  Sterile dressings were applied.  Patient tolerated the procedure well and had no immediate complications.  She was placed in a shoulder sling.  POSTOPERATIVE PLAN: Admit overnight for observation and pain control and discharge home in the morning.  Jaime Cecil, MD 2:31 PM

## 2019-06-21 NOTE — Anesthesia Procedure Notes (Signed)
Anesthesia Regional Block: Supraclavicular block   Pre-Anesthetic Checklist: ,, timeout performed, Correct Patient, Correct Site, Correct Laterality, Correct Procedure, Correct Position, site marked, Risks and benefits discussed,  Surgical consent,  Pre-op evaluation,  At surgeon's request and post-op pain management  Laterality: Left  Prep: chloraprep       Needles:  Injection technique: Single-shot  Needle Type: Echogenic Needle     Needle Length: 5cm  Needle Gauge: 21     Additional Needles:   Narrative:  Start time: 06/21/2019 12:15 PM End time: 06/21/2019 12:19 PM Injection made incrementally with aspirations every 5 mL.  Performed by: Personally  Anesthesiologist: Audry Pili, MD  Additional Notes: No pain on injection. No increased resistance to injection. Injection made in 5cc increments. Good needle visualization. Patient tolerated the procedure well.

## 2019-06-21 NOTE — Anesthesia Preprocedure Evaluation (Addendum)
Anesthesia Evaluation  Patient identified by MRN, date of birth, ID band Patient awake    Reviewed: Allergy & Precautions, NPO status , Patient's Chart, lab work & pertinent test results  History of Anesthesia Complications Negative for: history of anesthetic complications  Airway Mallampati: II  TM Distance: >3 FB Neck ROM: Full    Dental  (+) Dental Advisory Given, Teeth Intact   Pulmonary former smoker,    Pulmonary exam normal        Cardiovascular negative cardio ROS Normal cardiovascular exam     Neuro/Psych  Headaches, PSYCHIATRIC DISORDERS Anxiety    GI/Hepatic Neg liver ROS, GERD  Medicated and Controlled,  Endo/Other   Obesity   Renal/GU negative Renal ROS     Musculoskeletal negative musculoskeletal ROS (+)   Abdominal   Peds  Hematology negative hematology ROS (+)   Anesthesia Other Findings   Reproductive/Obstetrics  Breast cancer                             Anesthesia Physical Anesthesia Plan  ASA: II  Anesthesia Plan: General   Post-op Pain Management:  Regional for Post-op pain   Induction: Intravenous  PONV Risk Score and Plan: 3 and Treatment may vary due to age or medical condition, Ondansetron, Dexamethasone and Midazolam  Airway Management Planned: Oral ETT  Additional Equipment: None  Intra-op Plan:   Post-operative Plan: Extubation in OR  Informed Consent: I have reviewed the patients History and Physical, chart, labs and discussed the procedure including the risks, benefits and alternatives for the proposed anesthesia with the patient or authorized representative who has indicated his/her understanding and acceptance.     Dental advisory given  Plan Discussed with: CRNA and Anesthesiologist  Anesthesia Plan Comments:        Anesthesia Quick Evaluation

## 2019-06-21 NOTE — Discharge Instructions (Signed)
° °  Postoperative instructions:  Weightbearing instructions: non-weight bearing  Dressing instructions: Keep your dressing and/or splint clean and dry at all times.  It will be removed at your first post-operative appointment.  Your stitches and/or staples will be removed at this visit.  Incision instructions:  Do not soak your incision for 3 weeks after surgery.  If the incision gets wet, pat dry and do not scrub the incision.  Pain control:  You have been given a prescription to be taken as directed for post-operative pain control.  In addition, elevate the operative extremity above the heart at all times to prevent swelling and throbbing pain.  Take over-the-counter Colace, 100mg  by mouth twice a day while taking narcotic pain medications to help prevent constipation.  Follow up appointments: 1) 2 weeks for suture removal and wound check. 2) Dr. Erlinda Hong as scheduled.   -------------------------------------------------------------------------------------------------------------  After Surgery Pain Control:  After your surgery, post-surgical discomfort or pain is likely. This discomfort can last several days to a few weeks. At certain times of the day your discomfort may be more intense.  Did you receive a nerve block?  A nerve block can provide pain relief for one hour to two days after your surgery. As long as the nerve block is working, you will experience little or no sensation in the area the surgeon operated on.  As the nerve block wears off, you will begin to experience pain or discomfort. It is very important that you begin taking your prescribed pain medication before the nerve block fully wears off. Treating your pain at the first sign of the block wearing off will ensure your pain is better controlled and more tolerable when full-sensation returns. Do not wait until the pain is intolerable, as the medicine will be less effective. It is better to treat pain in advance than to try and  catch up.  General Anesthesia:  If you did not receive a nerve block during your surgery, you will need to start taking your pain medication shortly after your surgery and should continue to do so as prescribed by your surgeon.  Pain Medication:  Most commonly we prescribe Vicodin and Percocet for post-operative pain. Both of these medications contain a combination of acetaminophen (Tylenol) and a narcotic to help control pain.   It takes between 30 and 45 minutes before pain medication starts to work. It is important to take your medication before your pain level gets too intense.   Nausea is a common side effect of many pain medications. You will want to eat something before taking your pain medicine to help prevent nausea.   If you are taking a prescription pain medication that contains acetaminophen, we recommend that you do not take additional over the counter acetaminophen (Tylenol).  Other pain relieving options:   Using a cold pack to ice the affected area a few times a day (15 to 20 minutes at a time) can help to relieve pain, reduce swelling and bruising.   Elevation of the affected area can also help to reduce pain and swelling.

## 2019-06-21 NOTE — H&P (Signed)
PREOPERATIVE H&P  Chief Complaint: left humeral shaft fracture  HPI: Jaime Solomon is a 58 y.o. female who presents for surgical treatment of left humeral shaft fracture.  She denies any changes in medical history.  Past Medical History:  Diagnosis Date  . Anxiety   . Breast cancer ([place]) 2006  . Breast cancer ([place]) 05/22/2012  . GERD (gastroesophageal reflux disease)   . Headache   . Personal history of radiation therapy    Past Surgical History:  Procedure Laterality Date  . angiography  2010   head  . BREAST LUMPECTOMY Right 2006  . CHOLECYSTECTOMY    . MASTECTOMY PARTIAL / LUMPECTOMY  2006   right   . PARTIAL HYSTERECTOMY  1991  . ROTATOR CUFF REPAIR Right    Social History   Socioeconomic History  . Marital status: Divorced    Spouse name: Not on file  . Number of children: Not on file  . Years of education: Not on file  . Highest education level: Not on file  Occupational History  . Not on file  Social Needs  . Financial resource strain: Not on file  . Food insecurity    Worry: Not on file    Inability: Not on file  . Transportation needs    Medical: Not on file    Non-medical: Not on file  Tobacco Use  . Smoking status: Former Smoker    Quit date: 05/22/2005    Years since quitting: 14.0  . Smokeless tobacco: Never Used  Substance and Sexual Activity  . Alcohol use: Yes    Alcohol/week: 2.0 standard drinks    Types: 2 Glasses of wine per week  . Drug use: No  . Sexual activity: Not Currently  Lifestyle  . Physical activity    Days per week: Not on file    Minutes per session: Not on file  . Stress: Not on file  Relationships  . Social Herbalist on phone: Not on file    Gets together: Not on file    Attends religious service: Not on file    Active member of club or organization: Not on file    Attends meetings of clubs or organizations: Not on file    Relationship status: Not on file  Other Topics Concern  . Not on file  Social  History Narrative  . Not on file   Family History  Problem Relation Age of Onset  . Diabetes Father   . Hypertension Father    Allergies  Allergen Reactions  . Depakote [Divalproex Sodium] Other (See Comments)    hallucinations  . Topamax [Topiramate] Other (See Comments)    "feel like I'm in a fog, cant talk"   Prior to Admission medications   Medication Sig Start Date End Date Taking? Authorizing Provider  acetaminophen (TYLENOL 8 HOUR) 650 MG CR tablet Take 1 tablet (650 mg total) by mouth every 8 (eight) hours. 06/15/19   Varney Biles, MD  ALPRAZolam Duanne Moron) 0.5 MG tablet Take 0.5 mg by mouth at bedtime as needed for sleep.     [provider]  buPROPion (WELLBUTRIN XL) 300 MG 24 hr tablet Take 300 mg by mouth daily.    [provider]  citalopram (CELEXA) 10 MG tablet Take 10 mg by mouth daily.    [provider]  diphenhydrAMINE (SOMINEX) 25 MG tablet Take 50 mg by mouth at bedtime as needed for sleep.     [provider]  gabapentin (NEURONTIN) 300 MG capsule Take 2 capsules by mouth at bedtime.     [provider]  ibuprofen (ADVIL,MOTRIN) 800 MG tablet Take 800 mg by mouth every 8 (eight) hours as needed for moderate pain.     [provider]  nortriptyline (PAMELOR) 50 MG capsule Take 150 mg by mouth at bedtime.    [provider]  pantoprazole (PROTONIX) 40 MG tablet Take 40 mg by mouth daily.    [provider]  rizatriptan (MAXALT) 10 MG tablet Take 10 mg by mouth as needed for migraine. May repeat in 2 hours if needed     [provider]     Positive ROS: All other systems have been reviewed and were otherwise negative with the exception of those mentioned in the HPI and as above.  Physical Exam: General: Alert, no acute distress Cardiovascular: No pedal edema Respiratory: No cyanosis, no use of accessory musculature GI: abdomen soft Skin: No lesions in the area of chief complaint  Neurologic: Sensation intact distally Psychiatric: Patient is competent for consent with normal mood and affect Lymphatic: no lymphedema  MUSCULOSKELETAL: exam stable  Assessment: left humeral shaft fracture  Plan: Plan for Procedure(s): OPEN REDUCTION INTERNAL FIXATION (ORIF) LEFT HUMERAL SHAFT FRACTURE  The risks benefits and alternatives were discussed with the patient including but not limited to the risks of nonoperative treatment, versus surgical intervention including infection, bleeding, nerve injury,  blood clots, cardiopulmonary complications, morbidity, mortality, among others, and they were willing to proceed.   Preoperative templating of the joint replacement has been completed, documented, and submitted to the Operating Room personnel in order to optimize intra-operative equipment management.  Eduard Roux, MD   06/21/2019 7:18 AM

## 2019-06-22 ENCOUNTER — Encounter (HOSPITAL_COMMUNITY): Payer: Self-pay | Admitting: Orthopaedic Surgery

## 2019-06-22 ENCOUNTER — Other Ambulatory Visit: Payer: Self-pay | Admitting: Physician Assistant

## 2019-06-22 DIAGNOSIS — S42322A Displaced transverse fracture of shaft of humerus, left arm, initial encounter for closed fracture: Secondary | ICD-10-CM | POA: Diagnosis not present

## 2019-06-22 MED ORDER — ONDANSETRON HCL 4 MG PO TABS: 4.0000 mg | ORAL_TABLET | Freq: Three times a day (TID) | ORAL | 0 refills | Status: DC | PRN

## 2019-06-22 MED ORDER — CALCIUM CARBONATE-VITAMIN D 500-200 MG-UNIT PO TABS: 1.0000 | ORAL_TABLET | Freq: Three times a day (TID) | ORAL | 6 refills | Status: AC

## 2019-06-22 MED ORDER — ZINC SULFATE 220 (50 ZN) MG PO CAPS: 220.0000 mg | ORAL_CAPSULE | Freq: Every day | ORAL | 0 refills | Status: AC

## 2019-06-22 MED ORDER — OXYCODONE HCL 5 MG PO TABS: ORAL_TABLET | ORAL | 0 refills | Status: DC

## 2019-06-22 MED ORDER — INFLUENZA VAC SPLIT QUAD 0.5 ML IM SUSY: 0.5000 mL | PREFILLED_SYRINGE | INTRAMUSCULAR | Status: DC

## 2019-06-22 NOTE — Progress Notes (Signed)
Subjective: 1 Day Post-Op Procedure(s) (LRB): OPEN REDUCTION INTERNAL FIXATION (ORIF) LEFT HUMERAL SHAFT FRACTURE (Left) Patient reports pain as mild.    Objective: Vital signs in last 24 hours: Temp:  [97 F (36.1 C)-98.3 F (36.8 C)] 98.2 F (36.8 C) (10/20 0400) Pulse Rate:  [83-99] 84 (10/20 0400) Resp:  [11-34] 16 (10/20 0400) BP: (103-142)/(57-92) 105/67 (10/20 0400) SpO2:  [90 %-100 %] 96 % (10/20 0400) Weight:  [99.8 kg] 99.8 kg (10/19 2000)  Intake/Output from previous day: 10/19 0701 - 10/20 0700 In: 1240 [P.O.:240; I.V.:1000] Out: 450 [Urine:400; Blood:50] Intake/Output this shift: No intake/output data recorded.  No results for input(s): HGB in the last 72 hours. No results for input(s): WBC, RBC, HCT, PLT in the last 72 hours. Recent Labs    06/21/19 1134  NA 135  K 3.9  CL 103  CO2 23  BUN 11  CREATININE 0.79  GLUCOSE 105*  CALCIUM 9.3   No results for input(s): LABPT, INR in the last 72 hours.  Neurologically intact Neurovascular intact Sensation intact distally Intact pulses distally Dorsiflexion/Plantar flexion intact Incision: dressing C/D/I No cellulitis present Compartment soft   Assessment/Plan: 1 Day Post-Op Procedure(s) (LRB): OPEN REDUCTION INTERNAL FIXATION (ORIF) LEFT HUMERAL SHAFT FRACTURE (Left) PLAN NWB LUE- sling at all times Continue to ice and elevate LUE as much as possible D/c today F/u with Dr. Erlinda Hong 2 weeks post-op      Aundra Dubin 06/22/2019, 7:24 AM

## 2019-06-22 NOTE — Progress Notes (Signed)
Pt prescriptions were escribed to the wrong Pharmacy, spoke to [place], [place] and told her the correct [facility] [address] in [place] and she said she would change it.

## 2019-06-22 NOTE — Progress Notes (Signed)
Patient being transported to [facility] to be discharged

## 2019-06-22 NOTE — Plan of Care (Signed)

## 2019-06-22 NOTE — Discharge Summary (Signed)
Patient ID: Tanasia Finnicum MRN: [id number] DOB/AGE: 1961/06/01 58 y.o.  Admit date: 06/21/2019 Discharge date: 06/22/2019  Admission Diagnoses:  Principal Problem:   Closed displaced transverse fracture of shaft of left humerus Active Problems:   History of open reduction and internal fixation (ORIF) procedure   Discharge Diagnoses:  Same  Past Medical History:  Diagnosis Date  . Anxiety   . Breast cancer ([place]) 2006  . Breast cancer ([place]) 05/22/2012  . GERD (gastroesophageal reflux disease)   . Headache   . Personal history of radiation therapy     Surgeries: Procedure(s): OPEN REDUCTION INTERNAL FIXATION (ORIF) LEFT HUMERAL SHAFT FRACTURE on 06/21/2019   Consultants:   Discharged Condition: Improved  Hospital Course: Elleah Delling is an 58 y.o. female who was admitted 06/21/2019 for operative treatment ofClosed displaced transverse fracture of shaft of left humerus. Patient has severe unremitting pain that affects sleep, daily activities, and work/hobbies. After pre-op clearance the patient was taken to the operating room on 06/21/2019 and underwent  Procedure(s): OPEN REDUCTION INTERNAL FIXATION (ORIF) LEFT HUMERAL SHAFT FRACTURE.    Patient was given perioperative antibiotics:  Anti-infectives (From admission, onward)   Start     Dose/Rate Route Frequency Ordered Stop   06/21/19 2015  ceFAZolin (ANCEF) IVPB 2g/100 mL premix     2 g 200 mL/hr over 30 Minutes Intravenous Every 6 hours 06/21/19 2014 06/22/19 1414   06/21/19 1344  vancomycin (VANCOCIN) powder  Status:  Discontinued       As needed 06/21/19 1344 06/21/19 1505   06/21/19 1200  ceFAZolin (ANCEF) IVPB 2g/100 mL premix     2 g 200 mL/hr over 30 Minutes Intravenous To ShortStay [facility] 06/18/19 1239 06/21/19 1329       Patient was given sequential compression devices, early ambulation, and chemoprophylaxis to prevent DVT.  Patient benefited maximally from hospital stay and there were no complications.     Recent vital signs:  Patient Vitals for the past 24 hrs:  BP Temp Temp src Pulse Resp SpO2 Height Weight  06/22/19 0400 105/67 98.2 F (36.8 C) Oral 84 16 96 % - -  06/22/19 0015 (!) 103/57 97.8 F (36.6 C) Oral 93 16 93 % - -  06/21/19 2020 126/69 98 F (36.7 C) Oral 99 20 94 % - -  06/21/19 2000 - - - - - - 5\' 5"  (1.651 m) 99.8 kg  06/21/19 1854 - - - 97 - 95 % - -  06/21/19 1838 121/82 - - 98 - 94 % - -  06/21/19 1823 117/74 - - 90 - 93 % - -  06/21/19 1753 (!) 117/92 - - 92 - 94 % - -  06/21/19 1738 109/67 - - 92 - 94 % - -  06/21/19 1723 113/73 - - 88 - 91 % - -  06/21/19 1713 - - - 89 (!) 24 94 % - -  06/21/19 1708 120/78 - - 88 (!) 34 92 % - -  06/21/19 1654 116/71 - - 87 (!) 29 96 % - -  06/21/19 1638 122/75 - - 87 (!) 23 92 % - -  06/21/19 1623 122/74 - - 84 (!) 27 90 % - -  06/21/19 1553 125/75 - - 85 (!) 25 97 % - -  06/21/19 1538 130/77 - - 89 19 98 % - -  06/21/19 1526 131/77 - - 90 (!) 24 97 % - -  06/21/19 1511 - - - 83 18 95 % - -  06/21/19 1508  128/75 (!) 97 F (36.1 C) - 86 20 94 % - -  06/21/19 1220 (!) 142/81 - - 88 11 100 % - -  06/21/19 1215 - - - 83 13 99 % - -  06/21/19 1210 - - - 85 20 100 % - -  06/21/19 1115 - 98.3 F (36.8 C) Oral 85 20 98 % - -     Recent laboratory studies:  Recent Labs    06/21/19 1134  NA 135  K 3.9  CL 103  CO2 23  BUN 11  CREATININE 0.79  GLUCOSE 105*  CALCIUM 9.3     Discharge Medications:   Allergies as of 06/22/2019      Reactions   Depakote [divalproex Sodium] Other (See Comments)   hallucinations   Topamax [topiramate] Other (See Comments)   "feel like I'm in a fog, cant talk"      Medication List    STOP taking these medications   acetaminophen 650 MG CR tablet Commonly known as: Tylenol 8 Hour   ibuprofen 800 MG tablet Commonly known as: ADVIL   methocarbamol 500 MG tablet Commonly known as: ROBAXIN     TAKE these medications   ALPRAZolam 0.5 MG tablet Commonly known as: XANAX Take  0.5 mg by mouth at bedtime as needed for sleep.   buPROPion 300 MG 24 hr tablet Commonly known as: WELLBUTRIN XL Take 300 mg by mouth daily.   calcium-vitamin D 500-200 MG-UNIT tablet Commonly known as: OSCAL WITH D Take 1 tablet by mouth 3 (three) times daily.   citalopram 10 MG tablet Commonly known as: CELEXA Take 10 mg by mouth daily.   diphenhydrAMINE 25 MG tablet Commonly known as: SOMINEX Take 50 mg by mouth at bedtime as needed for sleep.   gabapentin 300 MG capsule Commonly known as: NEURONTIN Take 2 capsules by mouth at bedtime.   ketorolac 10 MG tablet Commonly known as: TORADOL Take 1 tablet (10 mg total) by mouth 2 (two) times daily as needed.   nortriptyline 50 MG capsule Commonly known as: PAMELOR Take 150 mg by mouth at bedtime.   ondansetron 4 MG tablet Commonly known as: ZOFRAN Take 1-2 tablets (4-8 mg total) by mouth every 8 (eight) hours as needed for nausea or vomiting.   oxyCODONE 5 MG immediate release tablet Commonly known as: Oxy IR/ROXICODONE Take 1-2 tablets (5-10 mg total) by mouth every 8 (eight) hours as needed for severe pain.   pantoprazole 40 MG tablet Commonly known as: PROTONIX Take 40 mg by mouth daily.   rizatriptan 10 MG tablet Commonly known as: MAXALT Take 10 mg by mouth as needed for migraine. May repeat in 2 hours if needed   zinc sulfate 220 (50 Zn) MG capsule Take 1 capsule (220 mg total) by mouth daily.       Diagnostic Studies: Dg Shoulder Left  Result Date: 06/15/2019 CLINICAL DATA:  Fall, pain EXAM: LEFT HUMERUS - 2+ VIEW; LEFT SHOULDER - 2+ VIEW COMPARISON:  None. FINDINGS: No fracture or dislocation of the left shoulder. There is moderate left acromioclavicular and mild left glenohumeral arthrosis. The partially imaged left chest is unremarkable. There is an oblique, mildly distracted and angulated fracture of the mid left humeral diaphysis. The distal humerus is intact. IMPRESSION: 1.  No fracture or dislocation  of the left shoulder. 2. There is an oblique, mildly distracted and angulated fracture of the mid left humeral diaphysis. The distal humerus is intact. Electronically Signed   By: Cristie Hem  Laqueta Carina M.D.   On: 06/15/2019 13:17   Dg Humerus Left  Result Date: 06/21/2019 CLINICAL DATA:  Left humerus fracture. EXAM: LEFT HUMERUS - 2+ VIEW; DG C-ARM 1-60 MIN COMPARISON:  06/15/2019 FINDINGS: Intraoperative spot films show placement of fixation plate and screws across a fracture of the humeral diaphysis, which is in near anatomic alignment. IMPRESSION: Internal fixation of humeral diaphyseal fracture in near anatomic alignment. Electronically Signed   By: Marlaine Hind M.D.   On: 06/21/2019 15:16   Dg Humerus Left  Result Date: 06/15/2019 CLINICAL DATA:  Fall, pain EXAM: LEFT HUMERUS - 2+ VIEW; LEFT SHOULDER - 2+ VIEW COMPARISON:  None. FINDINGS: No fracture or dislocation of the left shoulder. There is moderate left acromioclavicular and mild left glenohumeral arthrosis. The partially imaged left chest is unremarkable. There is an oblique, mildly distracted and angulated fracture of the mid left humeral diaphysis. The distal humerus is intact. IMPRESSION: 1.  No fracture or dislocation of the left shoulder. 2. There is an oblique, mildly distracted and angulated fracture of the mid left humeral diaphysis. The distal humerus is intact. Electronically Signed   By: Eddie Candle M.D.   On: 06/15/2019 13:17   Dg C-arm 1-60 Min  Result Date: 06/21/2019 CLINICAL DATA:  Left humerus fracture. EXAM: LEFT HUMERUS - 2+ VIEW; DG C-ARM 1-60 MIN COMPARISON:  06/15/2019 FINDINGS: Intraoperative spot films show placement of fixation plate and screws across a fracture of the humeral diaphysis, which is in near anatomic alignment. IMPRESSION: Internal fixation of humeral diaphyseal fracture in near anatomic alignment. Electronically Signed   By: Marlaine Hind M.D.   On: 06/21/2019 15:16    Disposition: Discharge disposition:  01-Home or Self Care         Follow-up Information    Leandrew Koyanagi, MD In 2 weeks.   Specialty: Orthopedic Surgery Why: For wound re-check, For suture removal Contact information: [address] [place] [address] [phone]            Signed: Aundra Dubin 06/22/2019, 7:26 AM

## 2019-06-22 NOTE — Plan of Care (Signed)
  Problem: Pain Managment: Goal: General experience of comfort will improve Outcome: Progressing   Problem: Safety: Goal: Ability to remain free from injury will improve Outcome: Progressing   Problem: Skin Integrity: Goal: Risk for impaired skin integrity will decrease Outcome: Progressing   

## 2019-06-29 ENCOUNTER — Inpatient Hospital Stay: Payer: Medicare Other | Admitting: Orthopaedic Surgery

## 2019-06-30 ENCOUNTER — Inpatient Hospital Stay: Payer: Medicare Other | Admitting: Orthopaedic Surgery

## 2019-07-01 ENCOUNTER — Inpatient Hospital Stay: Payer: Medicare Other | Admitting: Orthopaedic Surgery

## 2019-07-02 ENCOUNTER — Ambulatory Visit (INDEPENDENT_AMBULATORY_CARE_PROVIDER_SITE_OTHER): Payer: Medicare Other | Admitting: Orthopaedic Surgery

## 2019-07-02 ENCOUNTER — Ambulatory Visit (INDEPENDENT_AMBULATORY_CARE_PROVIDER_SITE_OTHER): Payer: Medicare Other

## 2019-07-02 ENCOUNTER — Other Ambulatory Visit: Payer: Self-pay

## 2019-07-02 DIAGNOSIS — S42322A Displaced transverse fracture of shaft of humerus, left arm, initial encounter for closed fracture: Secondary | ICD-10-CM

## 2019-07-02 MED ORDER — OXYCODONE HCL 5 MG PO TABS: 5.0000 mg | ORAL_TABLET | Freq: Every evening | ORAL | 0 refills | Status: DC | PRN

## 2019-07-02 NOTE — Progress Notes (Signed)
   Post-Op Visit Note   Patient: Jaime Solomon           Date of Birth: [birth year]           MRN: [id number] Visit Date: 07/02/2019 PCP: Shirline Frees, MD   Assessment & Plan:  Chief Complaint:  Chief Complaint  Patient presents with  . Left Upper Arm - Routine Post Op    06/21/2019 ORIF Left Humeral Shaft Fracture   Visit Diagnoses:  1. Closed displaced transverse fracture of shaft of left humerus, initial encounter     Plan: Rose-Marie is almost 2 weeks status post ORIF left humeral shaft fracture.  She is doing well.  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  She takes oxycodone at night.  Overall she is doing well.  Physical exam shows an intact surgical incision without any signs of infection.  She has moderate swelling.  She has a persistent radial nerve palsy.  She has a well fitting splint.  At this time she may begin outpatient PT for range of motion of the shoulder and elbow and wrist.  We will see her back in a month with repeat 2 view x-rays of the left humerus.  Activity limitations and restrictions were reviewed today.  Follow-Up Instructions: Return in about 4 weeks (around 07/30/2019).   Orders:  Orders Placed This Encounter  Procedures  . XR Humerus Left  . Ambulatory referral to Physical Therapy   Meds ordered this encounter  Medications  . oxyCODONE (OXY IR/ROXICODONE) 5 MG immediate release tablet    Sig: Take 1 tablet (5 mg total) by mouth at bedtime as needed for severe pain.    Dispense:  20 tablet    Refill:  0    Imaging: Xr Humerus Left  Result Date: 07/02/2019 Stable fixation and alignment of humerus fracture   PMFS History: Patient Active Problem List   Diagnosis Date Noted  . History of open reduction and internal fixation (ORIF) procedure 06/21/2019  . Closed displaced transverse fracture of shaft of left humerus 06/17/2019  . Breast cancer ([place]) 05/22/2005   Past Medical History:  Diagnosis Date  .  Anxiety   . Breast cancer ([place]) 2006  . Breast cancer ([place]) 05/22/2012  . GERD (gastroesophageal reflux disease)   . Headache   . Personal history of radiation therapy     Family History  Problem Relation Age of Onset  . Diabetes Father   . Hypertension Father     Past Surgical History:  Procedure Laterality Date  . angiography  2010   head  . BREAST LUMPECTOMY Right 2006  . CHOLECYSTECTOMY    . MASTECTOMY PARTIAL / LUMPECTOMY  2006   right   . ORIF HUMERUS FRACTURE Left 06/21/2019   Procedure: OPEN REDUCTION INTERNAL FIXATION (ORIF) LEFT HUMERAL SHAFT FRACTURE;  Surgeon: Leandrew Koyanagi, MD;  Location: [place];  Service: Orthopedics;  Laterality: Left;  . PARTIAL HYSTERECTOMY  1991  . ROTATOR CUFF REPAIR Right    Social History   Occupational History  . Not on file  Tobacco Use  . Smoking status: Former Smoker    Quit date: 05/22/2005    Years since quitting: 14.1  . Smokeless tobacco: Never Used  Substance and Sexual Activity  . Alcohol use: Yes    Alcohol/week: 2.0 standard drinks    Types: 2 Glasses of wine per week  . Drug use: No  . Sexual activity: Not Currently

## 2019-07-08 ENCOUNTER — Ambulatory Visit: Payer: Medicare Other | Admitting: Physical Therapy

## 2019-07-12 ENCOUNTER — Other Ambulatory Visit: Payer: Self-pay

## 2019-07-12 ENCOUNTER — Ambulatory Visit: Payer: Medicare Other | Admitting: Physical Therapy

## 2019-07-12 ENCOUNTER — Encounter: Payer: Self-pay | Admitting: Physical Therapy

## 2019-07-12 DIAGNOSIS — M79602 Pain in left arm: Secondary | ICD-10-CM

## 2019-07-12 DIAGNOSIS — R29818 Other symptoms and signs involving the nervous system: Secondary | ICD-10-CM | POA: Diagnosis not present

## 2019-07-12 DIAGNOSIS — M6281 Muscle weakness (generalized): Secondary | ICD-10-CM

## 2019-07-12 NOTE — Therapy (Signed)
[facility] [address] [place], [place], [address] Phone: [phone]   Fax:  [phone]  Physical Therapy Evaluation  Patient Details  Name: Jaime Solomon MRN: [id number] Date of Birth: [birth year] Referring Provider (PT): Leandrew Koyanagi, MD   Encounter Date: 07/12/2019  PT End of Session - 07/12/19 1232    Visit Number  1    Number of Visits  24    Date for PT Re-Evaluation  09/06/19    PT Start Time  [id number]    PT Stop Time  1100    PT Time Calculation (min)  37 min    Activity Tolerance  Patient tolerated treatment well    Behavior During Therapy  [facility] for tasks assessed/performed       Past Medical History:  Diagnosis Date  . Anxiety   . Breast cancer ([place]) 2006  . Breast cancer ([place]) 05/22/2012  . GERD (gastroesophageal reflux disease)   . Headache   . Personal history of radiation therapy     Past Surgical History:  Procedure Laterality Date  . angiography  2010   head  . BREAST LUMPECTOMY Right 2006  . CHOLECYSTECTOMY    . MASTECTOMY PARTIAL / LUMPECTOMY  2006   right   . ORIF HUMERUS FRACTURE Left 06/21/2019   Procedure: OPEN REDUCTION INTERNAL FIXATION (ORIF) LEFT HUMERAL SHAFT FRACTURE;  Surgeon: Leandrew Koyanagi, MD;  Location: [place];  Service: Orthopedics;  Laterality: Left;  . PARTIAL HYSTERECTOMY  1991  . ROTATOR CUFF REPAIR Right     There were no vitals filed for this visit.   Subjective Assessment - 07/12/19 1025    Subjective  Pt is a 58 y/o female who presents to [place] s/p fall on 06/15/19.  Pt reports she fell off a step ladder resulting in Lt humerus fx, with radial nerve palsy s/p ORIF on 06/21/2019.  Pt presents today with c/o of wrist more than shoulder.    Patient Stated Goals  improve function in wrist    Currently in Pain?  Yes    Pain Score  0-No pain   up to 6/10   Pain Location  Shoulder   into upper arm   Pain Orientation  Left    Pain Descriptors / Indicators  Aching;Sharp    Pain Type  Surgical  pain;Acute pain    Pain Onset  1 to 4 weeks ago    Pain Frequency  Intermittent    Aggravating Factors   movement    Pain Relieving Factors  avoiding movement, medication         [facility] PT Assessment - 07/12/19 1029      Assessment   Medical Diagnosis  s/p ORIF left humerus; Radial nerve palsy    Referring Provider (PT)  Leandrew Koyanagi, MD    Onset Date/Surgical Date  06/21/19    Hand Dominance  Right    Next MD Visit  08/03/19    Prior Therapy  none      Restrictions   Weight Bearing Restrictions  Yes    LUE Weight Bearing  Non weight bearing      Balance Screen   Has the patient fallen in the past 6 months  Yes    How many times?  1    Has the patient had a decrease in activity level because of a fear of falling?   Yes    Is the patient reluctant to leave their home because of a fear of falling?  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)    Leisure  nothing; spend time with friend, walking      Cognition   Overall Cognitive Status  Within Functional Limits for tasks assessed      Posture/Postural Control   Posture/Postural Control  Postural limitations    Postural Limitations  Rounded Shoulders;Forward head      ROM / Strength   AROM / PROM / Strength  AROM;PROM;Strength      AROM   Overall AROM Comments  sitting for shoulder flexion/abduction testing; substitution patterns with increased shoulder shrug noted    AROM Assessment Site  Shoulder;Elbow;Wrist    Right/Left Shoulder  Left    Left Shoulder Flexion  121 Degrees    Left Shoulder ABduction  90 Degrees    Left Shoulder Internal Rotation  --   FIR to lateral glute   Left Shoulder External Rotation  --   stiff to back of head   Right/Left Elbow  Left     Left Elbow Flexion  125    Left Elbow Extension  -25    Right/Left Wrist  Left    Left Wrist Extension  -5 Degrees    Left Wrist Flexion  60 Degrees      PROM   PROM Assessment Site  Shoulder;Elbow;Wrist    Right/Left Shoulder  Left    Left Shoulder Flexion  149 Degrees    Left Shoulder ABduction  111 Degrees    Right/Left Elbow  Left    Left Elbow Flexion  137    Left Elbow Extension  -19    Right/Left Wrist  Left    Left Wrist Extension  32 Degrees      Strength   Overall Strength Comments  1/5 Lt thumb extension    Strength Assessment Site  Shoulder;Elbow;Wrist;Hand    Right/Left Shoulder  Left    Left Shoulder Flexion  3-/5    Left Shoulder ABduction  3-/5    Right/Left Elbow  Left    Right Elbow Flexion  3-/5    Right/Left Wrist  Left    Left Wrist Flexion  3/5    Left Wrist Extension  2+/5    Left Wrist Radial Deviation  3/5    Left Wrist Ulnar Deviation  1/5      Palpation   Palpation comment  tightness noted in biceps; increased edema present throughout LUE                Objective measurements completed on examination: See above findings.      [place] Adult PT Treatment/Exercise - 07/12/19 1029      Exercises   Exercises  Hand;Wrist      Hand Exercises   Other Hand Exercises  towel squeeze - instructed; did not perform      Wrist Exercises   Wrist Extension  AAROM;Left;10 reps    Wrist Radial Deviation  AAROM;Left;10 reps    Other wrist exercises  prayer stretch 1x30 sec; wrist extensors stretch 2x30 sec; left             PT Education - 07/12/19 1231    Education Details  HEP, goals of care  Person(s) Educated  Patient    Methods  Explanation;Demonstration;Handout    Comprehension  Verbalized understanding;Returned demonstration;Need further instruction       PT Short Term Goals - 07/12/19 1237      PT SHORT TERM GOAL #1   Title  independent with initial HEP    Status  New    Target Date  08/09/19      PT SHORT TERM GOAL #2    Title  report pain < 4/10 with LUE for improved function    Status  New    Target Date  08/09/19      PT SHORT TERM GOAL #3   Title  improve Lt shoulder flexion and abduction by 15 deg AROM for improved function    Status  New    Target Date  08/09/19        PT Long Term Goals - 07/12/19 1238      PT LONG TERM GOAL #1   Title  independent with advanced HEP    Status  New    Target Date  09/06/19      PT LONG TERM GOAL #2   Title  Lt shoulder/elbow AROM WNL for without pain for improved function    Status  New    Target Date  09/06/19      PT LONG TERM GOAL #3   Title  Lt wrist extension AROM improved to at least 15 deg for improved strength and function    Status  New    Target Date  09/06/19      PT LONG TERM GOAL #4   Title  demonstrate at least 2/5 radial deviation and thumb extension for improved function    Status  New    Target Date  09/06/19             Plan - 07/12/19 1233    Clinical Impression Statement  Pt is a 58 y/o female who presents to [facility] s/p Lt humerus ORIF on [id number]; complicated with Lt radial nerve palsy.  Pt demonstrates decreased ROM and strength in LUE as well as postural abnormalities and pain affecting functional mobility.  Pt will benefit from PT to address deficits listed.    Personal Factors and Comorbidities  Other;Comorbidity 2   severity of injury   Comorbidities  hx breast cancer, anxiety, aneurysm    Examination-Activity Limitations  Bathing;Lift;Dressing;Hygiene/Grooming;Reach Overhead    Examination-Participation Restrictions  Yard Work;Meal Prep;Cleaning    Stability/Clinical Decision Making  Evolving/Moderate complexity    Clinical Decision Making  Moderate    Rehab Potential  Good    PT Frequency  2x / week    PT Duration  8 weeks    PT Treatment/Interventions  ADLs/Self Care Home Management;Cryotherapy;Electrical Stimulation;Ultrasound;Moist Heat;Therapeutic activities;Therapeutic exercise;Patient/family  education;Neuromuscular re-education;Manual techniques;Vasopneumatic Device;Taping;Dry needling;Passive range of motion    PT Next Visit Plan  review HEP, manual for ROM/flexibility, modalities for muscle activation    PT Home Exercise Plan  Access Code: [email or web address]    Consulted and Agree with Plan of Care  Patient       Patient will benefit from skilled therapeutic intervention in order to improve the following deficits and impairments:  Increased fascial restricitons, Increased muscle spasms, Pain, Postural dysfunction, Decreased range of motion, Decreased strength, Impaired UE functional use, Increased edema  Visit Diagnosis: Pain in left arm - Plan: PT plan of care cert/re-cert  Muscle weakness (generalized) - Plan: PT plan of care cert/re-cert  Other symptoms and signs involving the nervous  system - Plan: PT plan of care cert/re-cert     Problem List Patient Active Problem List   Diagnosis Date Noted  . History of open reduction and internal fixation (ORIF) procedure 06/21/2019  . Closed displaced transverse fracture of shaft of left humerus 06/17/2019  . Breast cancer ([place]) 05/22/2005      Laureen Abrahams, PT, DPT 07/12/19 12:50 PM     [facility] Physical Therapy [address] [place], [place], [address] Phone: [phone]   Fax:  [phone]  Name: Jaime Solomon MRN: [id number] Date of Birth: May 29, 1961

## 2019-07-12 NOTE — Patient Instructions (Signed)
Access Code: [email or web address]  URL: [email or web address]  Date: 07/12/2019  Prepared by: Faustino Congress   Exercises  Seated Gripping Towel - 10 reps - 1 sets - 5 sec hold - 2x daily - 7x weekly  Seated Wrist Radial Deviation AAROM with Caregiver - 10 reps - 1 sets - 2x daily - 7x weekly  Wrist Prayer Stretch - 3 reps - 1 sets - 30 sec hold - 2x daily - 7x weekly  Standing Wrist Flexion Stretch - 3 reps - 1 sets - 30 sec hold - 2x daily - 7x weekly  Wrist AROM Flexion Extension - 10 reps - 1 sets - 2x daily - 7x weekly  Seated Forearm Pronation and Supination AROM - 10 reps - 1 sets - 2x daily - 7x weekly

## 2019-07-15 ENCOUNTER — Encounter: Payer: Medicare Other | Admitting: Physical Therapy

## 2019-07-15 ENCOUNTER — Other Ambulatory Visit: Payer: Self-pay

## 2019-07-15 ENCOUNTER — Encounter: Payer: Self-pay | Admitting: Orthopaedic Surgery

## 2019-07-15 ENCOUNTER — Ambulatory Visit (INDEPENDENT_AMBULATORY_CARE_PROVIDER_SITE_OTHER): Payer: Medicare Other | Admitting: Orthopaedic Surgery

## 2019-07-15 VITALS — Ht 65.0 in | Wt 220.0 lb

## 2019-07-15 DIAGNOSIS — M7022 Olecranon bursitis, left elbow: Secondary | ICD-10-CM | POA: Diagnosis not present

## 2019-07-15 NOTE — Progress Notes (Signed)
Office Visit Note   Patient: Jaime Solomon           Date of Birth: [birth year]           MRN: [id number] Visit Date: 07/15/2019              Requested by: Shirline Frees, MD [address] [place],  [place] [address] PCP: Shirline Frees, MD   Assessment & Plan: Visit Diagnoses:  1. Olecranon bursitis of left elbow     Plan: Impression is left elbow olecranon bursa.  We will place the patient in a compression wrap.  She will apply ice as needed.  She will avoid resting her elbow on any hard surfaces.  She will follow-up with [place] at her regularly scheduled appointment for her left humerus fracture.  Follow-Up Instructions: Return for f/u at regularly scheduled appointment.   Orders:  No orders of the defined types were placed in this encounter.  No orders of the defined types were placed in this encounter.     Procedures: No procedures performed   Clinical Data: No additional findings.   Subjective: Chief Complaint  Patient presents with  . Arm Pain    ORIF left humeral shaft fracture DOS 06/21/2019    HPI patient is a pleasant 58 year old female who comes in today with concerns of her left elbow.  She is about 3 weeks out ORIF left humeral shaft fracture, date of surgery 06/21/2019.  She has been doing well.  She noticed swelling to her left elbow about 2 days ago.  No fevers or chills.  No pain and no redness.  No new injury.     Objective: Vital Signs: Ht 5\' 5"  (1.651 m)   Wt 220 lb (99.8 kg)   BMI 36.61 kg/m    Ortho Exam examination of the left elbow reveals a small sized olecranon bursa.  Nonerythematous.  Nontender.  No skin changes  Specialty Comments:  No specialty comments available.  Imaging: No new imaging   PMFS History: Patient Active Problem List   Diagnosis Date Noted  . History of open reduction and internal fixation (ORIF) procedure 06/21/2019  . Closed displaced transverse fracture of shaft of left humerus 06/17/2019  .  Breast cancer ([place]) 05/22/2005   Past Medical History:  Diagnosis Date  . Anxiety   . Breast cancer ([place]) 2006  . Breast cancer ([place]) 05/22/2012  . GERD (gastroesophageal reflux disease)   . Headache   . Personal history of radiation therapy     Family History  Problem Relation Age of Onset  . Diabetes Father   . Hypertension Father     Past Surgical History:  Procedure Laterality Date  . angiography  2010   head  . BREAST LUMPECTOMY Right 2006  . CHOLECYSTECTOMY    . MASTECTOMY PARTIAL / LUMPECTOMY  2006   right   . ORIF HUMERUS FRACTURE Left 06/21/2019   Procedure: OPEN REDUCTION INTERNAL FIXATION (ORIF) LEFT HUMERAL SHAFT FRACTURE;  Surgeon: Leandrew Koyanagi, MD;  Location: [facility];  Service: Orthopedics;  Laterality: Left;  . PARTIAL HYSTERECTOMY  1991  . ROTATOR CUFF REPAIR Right    Social History   Occupational History  . Not on file  Tobacco Use  . Smoking status: Former Smoker    Quit date: 05/22/2005    Years since quitting: 14.1  . Smokeless tobacco: Never Used  Substance and Sexual Activity  . Alcohol use: Yes    Alcohol/week: 2.0 standard drinks  Types: 2 Glasses of wine per week  . Drug use: No  . Sexual activity: Not Currently

## 2019-07-19 ENCOUNTER — Other Ambulatory Visit: Payer: Self-pay

## 2019-07-19 ENCOUNTER — Ambulatory Visit (INDEPENDENT_AMBULATORY_CARE_PROVIDER_SITE_OTHER): Payer: Medicare Other | Admitting: Physical Therapy

## 2019-07-19 DIAGNOSIS — M6281 Muscle weakness (generalized): Secondary | ICD-10-CM | POA: Diagnosis not present

## 2019-07-19 DIAGNOSIS — R29818 Other symptoms and signs involving the nervous system: Secondary | ICD-10-CM

## 2019-07-19 DIAGNOSIS — M79602 Pain in left arm: Secondary | ICD-10-CM | POA: Diagnosis not present

## 2019-07-19 NOTE — Therapy (Signed)
Jaime Solomon, [place], [address] Phone: [phone]   Fax:  [phone]  Physical Therapy Treatment  Patient Details  Name: Jaime Solomon MRN: [id number] Date of Birth: [birth year] Referring Provider (PT): Leandrew Koyanagi, MD   Encounter Date: 07/19/2019  PT End of Session - 07/19/19 1101    Visit Number  2    Number of Visits  24    Date for PT Re-Evaluation  09/06/19    PT Start Time  1020    PT Stop Time  1100    PT Time Calculation (min)  40 min    Activity Tolerance  Patient tolerated treatment well    Behavior During Therapy  [facility] for tasks assessed/performed       Past Medical History:  Diagnosis Date  . Anxiety   . Breast cancer ([place]) 2006  . Breast cancer ([place]) 05/22/2012  . GERD (gastroesophageal reflux disease)   . Headache   . Personal history of radiation therapy     Past Surgical History:  Procedure Laterality Date  . angiography  2010   head  . BREAST LUMPECTOMY Right 2006  . CHOLECYSTECTOMY    . MASTECTOMY PARTIAL / LUMPECTOMY  2006   right   . ORIF HUMERUS FRACTURE Left 06/21/2019   Procedure: OPEN REDUCTION INTERNAL FIXATION (ORIF) LEFT HUMERAL SHAFT FRACTURE;  Surgeon: Leandrew Koyanagi, MD;  Location: [facility];  Service: Orthopedics;  Laterality: Left;  . PARTIAL HYSTERECTOMY  1991  . ROTATOR CUFF REPAIR Right     There were no vitals filed for this visit.  Subjective Assessment - 07/19/19 1024    Subjective  Lt shoulder pain about 8/10 and Lt wrist pain about 6/10, shoulder feels tight and achey    Patient Stated Goals  improve function in wrist    Pain Onset  1 to 4 weeks ago                       [facility] Adult PT Treatment/Exercise - 07/19/19 0001      Exercises   Exercises  Other Exercises    Other Exercises   :Lt shoulder pendulums X 10 all  planes, Lt Elbow AROM X 15 reps,  Lt wrist AROM against gravity for flexion, extension, pronation/supination, radial/ulnar deviation  (gravity eliminated due to weakness for radial deviation) all X 15 reps, Lt hand opposition X 5 reps each, standing Lt shoulder felxion and abduction AAROM with [place] X 10 reps each holding 10 sec      Wrist Exercises   Other wrist exercises  prayer stretch 1x30 sec; wrist extensors stretch 2x30 sec; left      Manual Therapy   Manual therapy comments  PROM and STM, scar massage Lt shoulder, elbow, wrist               PT Short Term Goals - 07/12/19 1237      PT SHORT TERM GOAL #1   Title  independent with initial HEP    Status  New    Target Date  08/09/19      PT SHORT TERM GOAL #2   Title  report pain < 4/10 with LUE for improved function    Status  New    Target Date  08/09/19      PT SHORT TERM GOAL #3   Title  improve Lt shoulder flexion and abduction by 15 deg AROM for improved function    Status  New  Target Date  08/09/19        PT Long Term Goals - 07/12/19 1238      PT LONG TERM GOAL #1   Title  independent with advanced HEP    Status  New    Target Date  09/06/19      PT LONG TERM GOAL #2   Title  Lt shoulder/elbow AROM WNL for without pain for improved function    Status  New    Target Date  09/06/19      PT LONG TERM GOAL #3   Title  Lt wrist extension AROM improved to at least 15 deg for improved strength and function    Status  New    Target Date  09/06/19      PT LONG TERM GOAL #4   Title  demonstrate at least 2/5 radial deviation and thumb extension for improved function    Status  New    Target Date  09/06/19            Plan - 07/19/19 1101    Clinical Impression Statement  Session focused on stretching, ROM, and beginning strength for Lt UE to tolerance. She reports less pain and more ROM after manual therapy. Continue POC    Personal Factors and Comorbidities  Other;Comorbidity 2   severity of injury   Comorbidities  hx breast cancer, anxiety, aneurysm    Examination-Activity Limitations   Bathing;Lift;Dressing;Hygiene/Grooming;Reach Overhead    Examination-Participation Restrictions  Yard Work;Meal Prep;Cleaning    Stability/Clinical Decision Making  Evolving/Moderate complexity    Rehab Potential  Good    PT Frequency  2x / week    PT Duration  8 weeks    PT Treatment/Interventions  ADLs/Self Care Home Management;Cryotherapy;Electrical Stimulation;Ultrasound;Moist Heat;Therapeutic activities;Therapeutic exercise;Patient/family education;Neuromuscular re-education;Manual techniques;Vasopneumatic Device;Taping;Dry needling;Passive range of motion    PT Next Visit Plan  manual for ROM/flexibility, modalities for muscle activation    PT Home Exercise Plan  Access Code: [email or web address]    Consulted and Agree with Plan of Care  Patient       Patient will benefit from skilled therapeutic intervention in order to improve the following deficits and impairments:  Increased fascial restricitons, Increased muscle spasms, Pain, Postural dysfunction, Decreased range of motion, Decreased strength, Impaired UE functional use, Increased edema  Visit Diagnosis: Pain in left arm  Muscle weakness (generalized)  Other symptoms and signs involving the nervous system     Problem List Patient Active Problem List   Diagnosis Date Noted  . History of open reduction and internal fixation (ORIF) procedure 06/21/2019  . Closed displaced transverse fracture of shaft of left humerus 06/17/2019  . Breast cancer [facility]) 05/22/2005    Jaime Solomon 07/19/2019, 11:03 AM  [facility] Physical Therapy [address] [place], [place], [address] Phone: [phone]   Fax:  [phone]  Name: Jaime Solomon MRN: [id number] Date of Birth: 02-09-1961

## 2019-07-22 ENCOUNTER — Encounter: Payer: Self-pay | Admitting: Physical Therapy

## 2019-07-22 ENCOUNTER — Other Ambulatory Visit: Payer: Self-pay

## 2019-07-22 ENCOUNTER — Ambulatory Visit (INDEPENDENT_AMBULATORY_CARE_PROVIDER_SITE_OTHER): Payer: Medicare Other | Admitting: Physical Therapy

## 2019-07-22 DIAGNOSIS — R29818 Other symptoms and signs involving the nervous system: Secondary | ICD-10-CM

## 2019-07-22 DIAGNOSIS — M6281 Muscle weakness (generalized): Secondary | ICD-10-CM

## 2019-07-22 DIAGNOSIS — M79602 Pain in left arm: Secondary | ICD-10-CM

## 2019-07-22 NOTE — Therapy (Signed)
[facility] [address] [place], [place], [address] Phone: [phone]   Fax:  [phone]  Physical Therapy Treatment  Patient Details  Name: Jaime Solomon MRN: [id number] Date of Birth: 17-May-1961 Referring Provider (PT): Leandrew Koyanagi, MD   Encounter Date: 07/22/2019  PT End of Session - 07/22/19 1301    Visit Number  3    Number of Visits  24    Date for PT Re-Evaluation  09/06/19    PT Start Time  1150    PT Stop Time  1228    PT Time Calculation (min)  38 min    Activity Tolerance  Patient tolerated treatment well    Behavior During Therapy  [facility] for tasks assessed/performed       Past Medical History:  Diagnosis Date  . Anxiety   . Breast cancer ([place]) 2006  . Breast cancer ([place]) 05/22/2012  . GERD (gastroesophageal reflux disease)   . Headache   . Personal history of radiation therapy     Past Surgical History:  Procedure Laterality Date  . angiography  2010   head  . BREAST LUMPECTOMY Right 2006  . CHOLECYSTECTOMY    . MASTECTOMY PARTIAL / LUMPECTOMY  2006   right   . ORIF HUMERUS FRACTURE Left 06/21/2019   Procedure: OPEN REDUCTION INTERNAL FIXATION (ORIF) LEFT HUMERAL SHAFT FRACTURE;  Surgeon: Leandrew Koyanagi, MD;  Location: [place];  Service: Orthopedics;  Laterality: Left;  . PARTIAL HYSTERECTOMY  1991  . ROTATOR CUFF REPAIR Right     There were no vitals filed for this visit.  Subjective Assessment - 07/22/19 1151    Subjective  seeing some slow progress.    Patient Stated Goals  improve function in wrist    Currently in Pain?  Yes    Pain Score  6     Pain Location  Shoulder    Pain Orientation  Left    Pain Descriptors / Indicators  Aching;Sharp    Pain Type  Surgical pain;Acute pain    Pain Onset  1 to 4 weeks ago    Pain Frequency  Intermittent    Aggravating Factors   movement    Pain Relieving Factors  avoiding movement, medication                       [facility] Adult PT Treatment/Exercise  - 07/22/19 1201      Exercises   Exercises  Elbow      Elbow Exercises   Elbow Flexion  Left;15 reps;Seated;Theraband    Theraband Level (Elbow Flexion)  Level 1 (Yellow)    Elbow Extension  Left;15 reps;Seated;Theraband    Theraband Level (Elbow Extension)  Level 1 (Yellow)    Other elbow exercises  scapular retraction x15 reps; Level 4 band    Wrist Flexion  AAROM;10 reps    Wrist Flexion Limitations  eccentric lowering      Wrist Exercises   Wrist Radial Deviation  AAROM;Left;10 reps    Wrist Radial Deviation Limitations  eccentric lowering    Other wrist exercises  prayer stretch 3x30 sec; wrist extensors stretch 3x30 sec; left    Other wrist exercises  pronation/supination with Level 1 band x 15 reps      Modalities   Modalities  Electrical Stimulation      Electrical Stimulation   Electrical Stimulation Location  Lt wrist extensors    Electrical Stimulation Action  attempted [place] - unable to get good muscle activation  Electrical Stimulation Goals  Neuromuscular facilitation      Manual Therapy   Manual therapy comments  PROM and STM Lt shoulder, elbow, wrist               PT Short Term Goals - 07/12/19 1237      PT SHORT TERM GOAL #1   Title  independent with initial HEP    Status  New    Target Date  08/09/19      PT SHORT TERM GOAL #2   Title  report pain < 4/10 with LUE for improved function    Status  New    Target Date  08/09/19      PT SHORT TERM GOAL #3   Title  improve Lt shoulder flexion and abduction by 15 deg AROM for improved function    Status  New    Target Date  08/09/19        PT Long Term Goals - 07/12/19 1238      PT LONG TERM GOAL #1   Title  independent with advanced HEP    Status  New    Target Date  09/06/19      PT LONG TERM GOAL #2   Title  Lt shoulder/elbow AROM WNL for without pain for improved function    Status  New    Target Date  09/06/19      PT LONG TERM GOAL #3   Title  Lt wrist extension AROM  improved to at least 15 deg for improved strength and function    Status  New    Target Date  09/06/19      PT LONG TERM GOAL #4   Title  demonstrate at least 2/5 radial deviation and thumb extension for improved function    Status  New    Target Date  09/06/19            Plan - 07/22/19 1302    Clinical Impression Statement  Attempted NMES today but unable to get good muscle activation at this time.  Demonstrated improved wrist ext against gravity today.  Progressing well towards PT at this time.    Personal Factors and Comorbidities  Other;Comorbidity 2   severity of injury   Comorbidities  hx breast cancer, anxiety, aneurysm    Examination-Activity Limitations  Bathing;Lift;Dressing;Hygiene/Grooming;Reach Overhead    Examination-Participation Restrictions  Yard Work;Meal Prep;Cleaning    Stability/Clinical Decision Making  Evolving/Moderate complexity    Rehab Potential  Good    PT Frequency  2x / week    PT Duration  8 weeks    PT Treatment/Interventions  ADLs/Self Care Home Management;Cryotherapy;Electrical Stimulation;Ultrasound;Moist Heat;Therapeutic activities;Therapeutic exercise;Patient/family education;Neuromuscular re-education;Manual techniques;Vasopneumatic Device;Taping;Dry needling;Passive range of motion    PT Next Visit Plan  review HEP, manual for ROM/flexibility, modalities for muscle activation    PT Home Exercise Plan  Access Code: [email or web address]    Consulted and Agree with Plan of Care  Patient       Patient will benefit from skilled therapeutic intervention in order to improve the following deficits and impairments:  Increased fascial restricitons, Increased muscle spasms, Pain, Postural dysfunction, Decreased range of motion, Decreased strength, Impaired UE functional use, Increased edema  Visit Diagnosis: Pain in left arm  Muscle weakness (generalized)  Other symptoms and signs involving the nervous system     Problem List Patient Active Problem List    Diagnosis Date Noted  . History of open reduction and internal fixation (ORIF) procedure 06/21/2019  . Closed  displaced transverse fracture of shaft of left humerus 06/17/2019  . Breast cancer ([place]) 05/22/2005       Laureen Abrahams, PT, DPT 07/22/19 1:04 PM      [facility] [address] [place], [place], [address] Phone: [phone]   Fax:  [phone]  Name: Adeja Beringer MRN: [id number] Date of Birth: October 09, 1960

## 2019-07-26 ENCOUNTER — Encounter: Payer: Self-pay | Admitting: Physical Therapy

## 2019-07-26 ENCOUNTER — Ambulatory Visit (INDEPENDENT_AMBULATORY_CARE_PROVIDER_SITE_OTHER): Payer: Medicare Other | Admitting: Physical Therapy

## 2019-07-26 ENCOUNTER — Other Ambulatory Visit: Payer: Self-pay

## 2019-07-26 DIAGNOSIS — M6281 Muscle weakness (generalized): Secondary | ICD-10-CM | POA: Diagnosis not present

## 2019-07-26 DIAGNOSIS — M79602 Pain in left arm: Secondary | ICD-10-CM | POA: Diagnosis not present

## 2019-07-26 DIAGNOSIS — R29818 Other symptoms and signs involving the nervous system: Secondary | ICD-10-CM | POA: Diagnosis not present

## 2019-07-26 NOTE — Therapy (Signed)
[place], [place], [address] Phone: [phone]   Fax:  [phone]  Physical Therapy Treatment  Patient Details  Name: Jaime Solomon MRN: [id number] Date of Birth: 03-Aug-1961 Referring Provider (PT): Leandrew Koyanagi, MD   Encounter Date: 07/26/2019  PT End of Session - 07/26/19 1112    Visit Number  4    Number of Visits  24    Date for PT Re-Evaluation  09/06/19    PT Start Time  [id number]    PT Stop Time  1100    PT Time Calculation (min)  45 min    Activity Tolerance  Patient tolerated treatment well    Behavior During Therapy  [facility] for tasks assessed/performed       Past Medical History:  Diagnosis Date  . Anxiety   . Breast cancer ([place]) 2006  . Breast cancer ([place]) 05/22/2012  . GERD (gastroesophageal reflux disease)   . Headache   . Personal history of radiation therapy     Past Surgical History:  Procedure Laterality Date  . angiography  2010   head  . BREAST LUMPECTOMY Right 2006  . CHOLECYSTECTOMY    . MASTECTOMY PARTIAL / LUMPECTOMY  2006   right   . ORIF HUMERUS FRACTURE Left 06/21/2019   Procedure: OPEN REDUCTION INTERNAL FIXATION (ORIF) LEFT HUMERAL SHAFT FRACTURE;  Surgeon: Leandrew Koyanagi, MD;  Location: [facility];  Service: Orthopedics;  Laterality: Left;  . PARTIAL HYSTERECTOMY  1991  . ROTATOR CUFF REPAIR Right     There were no vitals filed for this visit.  Subjective Assessment - 07/26/19 1042    Subjective  some days I can lift my wrist up, other days I cant, today I can.    Patient Stated Goals  improve function in wrist    Currently in Pain?  Yes    Pain Score  4     Pain Location  Wrist    Pain Orientation  Left    Pain Descriptors / Indicators  Aching    Pain Onset  1 to 4 weeks ago                       [id number] Adult PT Treatment/Exercise - 07/26/19 0001      Exercises   Exercises  Shoulder      Elbow Exercises   Theraband Level (Elbow Flexion)  Level 2 (Red)     Elbow Extension  Left;20 reps    Theraband Level (Elbow Extension)  Level 2 (Red)      Shoulder Exercises: Standing   Flexion  Both;AROM;15 reps    ABduction  Both;AROM;15 reps    Row  Both;20 reps    Theraband Level (Shoulder Row)  Level 4 (Blue)      Shoulder Exercises: Stretch   Elbow Flexion  Left;Seated;Theraband;20 reps      Hand Exercises   Other Hand Exercises  gripping airex foam pad 2X15    Other Hand Exercises  radial/ulnar deviation of twisting on/off the BAPS board levels      Wrist Exercises   Other wrist exercises  prayer stretch 3x30 sec; wrist extensors stretch 3x30 sec; left    Other wrist exercises  pronation/supination with Level 1 band x 15 reps      Modalities   Modalities  Electrical Stimulation      Electrical Stimulation   Electrical Stimulation Location  Lt wrist extensors and into Lt wrist radial deviation  Chartered certified accountant  Russian Writer Parameters  10/10 rest hold (4 min for extensors, 4 min for radial deviaiton)    Electrical Stimulation Goals  Neuromuscular facilitation      Manual Therapy   Manual therapy comments  PROM and STM Lt shoulder, elbow, wrist               PT Short Term Goals - 07/12/19 1237      PT SHORT TERM GOAL #1   Title  independent with initial HEP    Status  New    Target Date  08/09/19      PT SHORT TERM GOAL #2   Title  report pain < 4/10 with LUE for improved function    Status  New    Target Date  08/09/19      PT SHORT TERM GOAL #3   Title  improve Lt shoulder flexion and abduction by 15 deg AROM for improved function    Status  New    Target Date  08/09/19        PT Long Term Goals - 07/12/19 1238      PT LONG TERM GOAL #1   Title  independent with advanced HEP    Status  New    Target Date  09/06/19      PT LONG TERM GOAL #2   Title  Lt shoulder/elbow AROM WNL for without pain for improved function    Status  New    Target Date  09/06/19      PT  LONG TERM GOAL #3   Title  Lt wrist extension AROM improved to at least 15 deg for improved strength and function    Status  New    Target Date  09/06/19      PT LONG TERM GOAL #4   Title  demonstrate at least 2/5 radial deviation and thumb extension for improved function    Status  New    Target Date  09/06/19            Plan - 07/26/19 1112    Clinical Impression Statement  Again performed NMES for wrist extension and radial deviaiton activation. She still cannot achieve full AROM but did appear to have more movement with this today. Progressed shoulder strength and ROM as tolerated and shoulder ROM is improving. Continue POC    Personal Factors and Comorbidities  Other;Comorbidity 2   severity of injury   Comorbidities  hx breast cancer, anxiety, aneurysm    Examination-Activity Limitations  Bathing;Lift;Dressing;Hygiene/Grooming;Reach Overhead    Examination-Participation Restrictions  Yard Work;Meal Prep;Cleaning    Stability/Clinical Decision Making  Evolving/Moderate complexity    Rehab Potential  Good    PT Frequency  2x / week    PT Duration  8 weeks    PT Treatment/Interventions  ADLs/Self Care Home Management;Cryotherapy;Electrical Stimulation;Ultrasound;Moist Heat;Therapeutic activities;Therapeutic exercise;Patient/family education;Neuromuscular re-education;Manual techniques;Vasopneumatic Device;Taping;Dry needling;Passive range of motion    PT Next Visit Plan  review HEP, manual for ROM/flexibility, modalities for muscle activation    PT Home Exercise Plan  Access Code: [email or web address]    Consulted and Agree with Plan of Care  Patient       Patient will benefit from skilled therapeutic intervention in order to improve the following deficits and impairments:  Increased fascial restricitons, Increased muscle spasms, Pain, Postural dysfunction, Decreased range of motion, Decreased strength, Impaired UE functional use, Increased edema  Visit Diagnosis: Pain in left  arm  Muscle weakness (  generalized)  Other symptoms and signs involving the nervous system     Problem List Patient Active Problem List   Diagnosis Date Noted  . History of open reduction and internal fixation (ORIF) procedure 06/21/2019  . Closed displaced transverse fracture of shaft of left humerus 06/17/2019  . Breast cancer [facility]) 05/22/2005    [name] 07/26/2019, 11:16 AM  [facility] [address] [place], [place], [address] Phone: [phone]   Fax:  [phone]  Name: Yariah Griffitts MRN: [id number] Date of Birth: 1961-04-05

## 2019-07-28 ENCOUNTER — Encounter: Payer: Medicare Other | Admitting: Physical Therapy

## 2019-08-02 ENCOUNTER — Encounter: Payer: Medicare Other | Admitting: Physical Therapy

## 2019-08-03 ENCOUNTER — Ambulatory Visit (INDEPENDENT_AMBULATORY_CARE_PROVIDER_SITE_OTHER): Payer: Medicare Other | Admitting: Orthopaedic Surgery

## 2019-08-03 ENCOUNTER — Ambulatory Visit (INDEPENDENT_AMBULATORY_CARE_PROVIDER_SITE_OTHER): Payer: Medicare Other

## 2019-08-03 ENCOUNTER — Encounter: Payer: Self-pay | Admitting: Orthopaedic Surgery

## 2019-08-03 ENCOUNTER — Other Ambulatory Visit: Payer: Self-pay

## 2019-08-03 VITALS — Ht 65.0 in | Wt 220.0 lb

## 2019-08-03 DIAGNOSIS — S42322A Displaced transverse fracture of shaft of humerus, left arm, initial encounter for closed fracture: Secondary | ICD-10-CM

## 2019-08-03 MED ORDER — HYDROCODONE-ACETAMINOPHEN 5-325 MG PO TABS: 1.0000 | ORAL_TABLET | Freq: Two times a day (BID) | ORAL | 0 refills | Status: DC | PRN

## 2019-08-03 NOTE — Addendum Note (Signed)
Addended by: Precious Bard on: 08/03/2019 10:36 AM   Modules accepted: Orders

## 2019-08-03 NOTE — Progress Notes (Addendum)
Post-Op Visit Note   Patient: Jaime Solomon           Date of Birth: 1961/05/04           MRN: [id number] Visit Date: 08/03/2019 PCP: Shirline Frees, MD   Assessment & Plan:  Chief Complaint:  Chief Complaint  Patient presents with  . Left Wrist - Follow-up  . Arm Injury    left humerus fracture   Visit Diagnoses:  1. Closed displaced transverse fracture of shaft of left humerus, initial encounter     Plan: Patient is a pleasant 58 year old female who presents our clinic today approximately 6 weeks status post ORIF left humeral shaft fracture, date of surgery 06/21/2019.  She has been doing well.  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  She has been in physical therapy.  Examination of her left upper extremity reveals a fully healed surgical scar without complication.  She is able to fully flex her shoulder.  She still has a small sized olecranon bursa which is minimally tender.  Nonerythematous and no drainage.  She has regained her EPI function but not EPL.  She has slight ulnar nerve deviation with wrist extension.  Slightly decreased sensation to the superficial radial nerve.  At this point, she will continue wearing her wrist splint.  Continue with physical therapy.  She can advance to weightbearing as tolerated to the left upper extremity.  We will order nerve conduction study/EMG left upper extremity.  Follow-up with [place] in 6 weeks time for repeat evaluation and 2 view x-rays of the left humerus.  Call with concerns or questions.  Follow-Up Instructions: Return in about 6 weeks (around 09/14/2019).   Orders:  Orders Placed This Encounter  Procedures  . XR Humerus Left  . Ambulatory referral to Physical Medicine Rehab   Meds ordered this encounter  Medications  . HYDROcodone-acetaminophen (NORCO) 5-325 MG tablet    Sig: Take 1-2 tablets by mouth 2 (two) times daily as needed for moderate pain.    Dispense:  20 tablet    Refill:  0    Imaging: Xr  Humerus Left  Result Date: 08/03/2019 X-rays demonstrate stable alignment of the fracture with evidence of bony consolidation   PMFS History: Patient Active Problem List   Diagnosis Date Noted  . History of open reduction and internal fixation (ORIF) procedure 06/21/2019  . Closed displaced transverse fracture of shaft of left humerus 06/17/2019  . Breast cancer ([place]) 05/22/2005   Past Medical History:  Diagnosis Date  . Anxiety   . Breast cancer ([place]) 2006  . Breast cancer ([place]) 05/22/2012  . GERD (gastroesophageal reflux disease)   . Headache   . Personal history of radiation therapy     Family History  Problem Relation Age of Onset  . Diabetes Father   . Hypertension Father     Past Surgical History:  Procedure Laterality Date  . angiography  2010   head  . BREAST LUMPECTOMY Right 2006  . CHOLECYSTECTOMY    . MASTECTOMY PARTIAL / LUMPECTOMY  2006   right   . ORIF HUMERUS FRACTURE Left 06/21/2019   Procedure: OPEN REDUCTION INTERNAL FIXATION (ORIF) LEFT HUMERAL SHAFT FRACTURE;  Surgeon: Leandrew Koyanagi, MD;  Location: [place];  Service: Orthopedics;  Laterality: Left;  . PARTIAL HYSTERECTOMY  1991  . ROTATOR CUFF REPAIR Right    Social History   Occupational History  . Not on file  Tobacco Use  . Smoking status: Former  Smoker    Quit date: 05/22/2005    Years since quitting: 14.2  . Smokeless tobacco: Never Used  Substance and Sexual Activity  . Alcohol use: Yes    Alcohol/week: 2.0 standard drinks    Types: 2 Glasses of wine per week  . Drug use: No  . Sexual activity: Not Currently

## 2019-08-05 ENCOUNTER — Encounter: Payer: Medicare Other | Admitting: Physical Therapy

## 2019-08-05 ENCOUNTER — Other Ambulatory Visit: Payer: Self-pay

## 2019-08-05 ENCOUNTER — Ambulatory Visit (INDEPENDENT_AMBULATORY_CARE_PROVIDER_SITE_OTHER): Payer: Medicare Other | Admitting: Physical Therapy

## 2019-08-05 ENCOUNTER — Encounter: Payer: Self-pay | Admitting: Physical Therapy

## 2019-08-05 DIAGNOSIS — M79602 Pain in left arm: Secondary | ICD-10-CM

## 2019-08-05 DIAGNOSIS — R29818 Other symptoms and signs involving the nervous system: Secondary | ICD-10-CM

## 2019-08-05 DIAGNOSIS — M6281 Muscle weakness (generalized): Secondary | ICD-10-CM | POA: Diagnosis not present

## 2019-08-05 NOTE — Therapy (Signed)
[place], [place], [address] Phone: [phone]   Fax:  [phone]  Physical Therapy Treatment  Patient Details  Name: Jaime Solomon MRN: [id number] Date of Birth: 05-06-1961 Referring Provider (PT): Leandrew Koyanagi, MD   Encounter Date: 08/05/2019  PT End of Session - 08/05/19 1058    Visit Number  5    Number of Visits  24    Date for PT Re-Evaluation  09/06/19    PT Start Time  1020    PT Stop Time  1059    PT Time Calculation (min)  39 min    Activity Tolerance  Patient tolerated treatment well    Behavior During Therapy  [facility] for tasks assessed/performed       Past Medical History:  Diagnosis Date  . Anxiety   . Breast cancer ([place]) 2006  . Breast cancer ([place]) 05/22/2012  . GERD (gastroesophageal reflux disease)   . Headache   . Personal history of radiation therapy     Past Surgical History:  Procedure Laterality Date  . angiography  2010   head  . BREAST LUMPECTOMY Right 2006  . CHOLECYSTECTOMY    . MASTECTOMY PARTIAL / LUMPECTOMY  2006   right   . ORIF HUMERUS FRACTURE Left 06/21/2019   Procedure: OPEN REDUCTION INTERNAL FIXATION (ORIF) LEFT HUMERAL SHAFT FRACTURE;  Surgeon: Leandrew Koyanagi, MD;  Location: [place];  Service: Orthopedics;  Laterality: Left;  . PARTIAL HYSTERECTOMY  1991  . ROTATOR CUFF REPAIR Right     There were no vitals filed for this visit.  Subjective Assessment - 08/05/19 1022    Subjective  hand is sore, doing band exercises.  plans for a nerve conduction study    Patient Stated Goals  improve function in wrist    Currently in Pain?  Yes    Pain Score  5    wrist 8/10   Pain Location  Arm    Pain Orientation  Left    Pain Descriptors / Indicators  Aching    Pain Type  Acute pain;Surgical pain    Pain Onset  1 to 4 weeks ago    Pain Frequency  Intermittent    Aggravating Factors   movement    Pain Relieving Factors  rest, medication                        [facility] Adult PT Treatment/Exercise - 08/05/19 1026      Elbow Exercises   Elbow Flexion  Left;15 reps;Standing;Bar weights/barbell    Bar Weights/Barbell (Elbow Flexion)  1 lb    Forearm Supination  Left;5 reps;Bar weights/barbell    Bar Weights/Barbell (Forearm Supination)  1 lb      Shoulder Exercises: Standing   Flexion  Left;Weights;15 reps    Shoulder Flexion Weight (lbs)  1# cuff weight    ABduction  Left;15 reps;Weights    Shoulder ABduction Weight (lbs)  1# cuff weight    ABduction Limitations  scaption      Wrist Exercises   Wrist Extension  AAROM;Left   with estim   Other wrist exercises  prayer stretch 3x30 sec; wrist extensors stretch 3x30 sec; left      Electrical Stimulation   Electrical Stimulation Location  Lt wrist extensors     Electrical Stimulation Action  Russian    Electrical Stimulation Parameters  10/10 x 10 min    Electrical Stimulation Goals  Neuromuscular facilitation  PT Short Term Goals - 07/12/19 1237      PT SHORT TERM GOAL #1   Title  independent with initial HEP    Status  New    Target Date  08/09/19      PT SHORT TERM GOAL #2   Title  report pain < 4/10 with LUE for improved function    Status  New    Target Date  08/09/19      PT SHORT TERM GOAL #3   Title  improve Lt shoulder flexion and abduction by 15 deg AROM for improved function    Status  New    Target Date  08/09/19        PT Long Term Goals - 07/12/19 1238      PT LONG TERM GOAL #1   Title  independent with advanced HEP    Status  New    Target Date  09/06/19      PT LONG TERM GOAL #2   Title  Lt shoulder/elbow AROM WNL for without pain for improved function    Status  New    Target Date  09/06/19      PT LONG TERM GOAL #3   Title  Lt wrist extension AROM improved to at least 15 deg for improved strength and function    Status  New    Target Date  09/06/19      PT LONG TERM GOAL #4   Title   demonstrate at least 2/5 radial deviation and thumb extension for improved function    Status  New    Target Date  09/06/19            Plan - 08/05/19 1059    Clinical Impression Statement  Pt with increased muscle activation today with NMES and continues to slowly progress with functional return of Lt wrist/hand.  Will continue to benefit from PT to maximize function.    Personal Factors and Comorbidities  Other;Comorbidity 2   severity of injury   Comorbidities  hx breast cancer, anxiety, aneurysm    Examination-Activity Limitations  Bathing;Lift;Dressing;Hygiene/Grooming;Reach Overhead    Examination-Participation Restrictions  Yard Work;Meal Prep;Cleaning    Stability/Clinical Decision Making  Evolving/Moderate complexity    Rehab Potential  Good    PT Frequency  2x / week    PT Duration  8 weeks    PT Treatment/Interventions  ADLs/Self Care Home Management;Cryotherapy;Electrical Stimulation;Ultrasound;Moist Heat;Therapeutic activities;Therapeutic exercise;Patient/family education;Neuromuscular re-education;Manual techniques;Vasopneumatic Device;Taping;Dry needling;Passive range of motion    PT Next Visit Plan  review HEP, manual for ROM/flexibility, modalities for muscle activation    PT Home Exercise Plan  Access Code: [email or web address]    Consulted and Agree with Plan of Care  Patient       Patient will benefit from skilled therapeutic intervention in order to improve the following deficits and impairments:  Increased fascial restricitons, Increased muscle spasms, Pain, Postural dysfunction, Decreased range of motion, Decreased strength, Impaired UE functional use, Increased edema  Visit Diagnosis: Pain in left arm  Muscle weakness (generalized)  Other symptoms and signs involving the nervous system     Problem List Patient Active Problem List   Diagnosis Date Noted  . History of open reduction and internal fixation (ORIF) procedure 06/21/2019  . Closed displaced transverse  fracture of shaft of left humerus 06/17/2019  . Breast cancer ([place]) 05/22/2005      Laureen Abrahams, PT, DPT 08/05/19 11:00 AM     [facility] [address]  [place], [place], [address] Phone: [phone]   Fax:  [phone]  Name: Jaime Solomon MRN: [id number] Date of Birth: 12-15-1960

## 2019-08-09 ENCOUNTER — Telehealth: Payer: Self-pay | Admitting: Physical Therapy

## 2019-08-09 ENCOUNTER — Encounter: Payer: Medicare Other | Admitting: Physical Therapy

## 2019-08-09 NOTE — Telephone Encounter (Signed)
Called pt due to NS, pt thought appt was different day.  Reminded of next appt.  Laureen Abrahams, PT, DPT 08/09/19 10:33 AM

## 2019-08-12 ENCOUNTER — Other Ambulatory Visit: Payer: Self-pay

## 2019-08-12 ENCOUNTER — Ambulatory Visit (INDEPENDENT_AMBULATORY_CARE_PROVIDER_SITE_OTHER): Payer: Medicare Other | Admitting: Physical Therapy

## 2019-08-12 ENCOUNTER — Encounter: Payer: Self-pay | Admitting: Physical Therapy

## 2019-08-12 DIAGNOSIS — R29818 Other symptoms and signs involving the nervous system: Secondary | ICD-10-CM | POA: Diagnosis not present

## 2019-08-12 DIAGNOSIS — M79602 Pain in left arm: Secondary | ICD-10-CM

## 2019-08-12 DIAGNOSIS — M6281 Muscle weakness (generalized): Secondary | ICD-10-CM

## 2019-08-12 NOTE — Therapy (Signed)
[facility], [place], [address] Phone: [phone]   Fax:  [phone]  Physical Therapy Treatment  Patient Details  Name: Jaime Solomon MRN: [id number] Date of Birth: 08/21/1961 Referring Provider (PT): Leandrew Koyanagi, MD   Encounter Date: 08/12/2019  PT End of Session - 08/12/19 1140    Visit Number  6    Number of Visits  24    Date for PT Re-Evaluation  09/06/19    PT Start Time  1109    PT Stop Time  1140    PT Time Calculation (min)  31 min    Activity Tolerance  Patient tolerated treatment well    Behavior During Therapy  [facility] for tasks assessed/performed       Past Medical History:  Diagnosis Date  . Anxiety   . Breast cancer ([place]) 2006  . Breast cancer ([place]) 05/22/2012  . GERD (gastroesophageal reflux disease)   . Headache   . Personal history of radiation therapy     Past Surgical History:  Procedure Laterality Date  . angiography  2010   head  . BREAST LUMPECTOMY Right 2006  . CHOLECYSTECTOMY    . MASTECTOMY PARTIAL / LUMPECTOMY  2006   right   . ORIF HUMERUS FRACTURE Left 06/21/2019   Procedure: OPEN REDUCTION INTERNAL FIXATION (ORIF) LEFT HUMERAL SHAFT FRACTURE;  Surgeon: Leandrew Koyanagi, MD;  Location: [place];  Service: Orthopedics;  Laterality: Left;  . PARTIAL HYSTERECTOMY  1991  . ROTATOR CUFF REPAIR Right     There were no vitals filed for this visit.  Subjective Assessment - 08/12/19 1112    Subjective  pain stays at a 3-4/10 then increases to 6-7/10 with activity    Patient Stated Goals  improve function in wrist    Currently in Pain?  Yes    Pain Score  6     Pain Location  Arm    Pain Orientation  Left;Upper    Pain Descriptors / Indicators  Aching    Pain Type  Acute pain;Surgical pain    Pain Onset  1 to 4 weeks ago    Pain Frequency  Intermittent    Aggravating Factors   movement    Pain Relieving Factors  rest, medication                       [facility] Adult PT  Treatment/Exercise - 08/12/19 1113      Elbow Exercises   Forearm Supination  AROM;Left;20 reps    Forearm Pronation  AROM;Left;20 reps      Shoulder Exercises: Seated   Other Seated Exercises  wrist extension x 10 sec holds with estim      Wrist Exercises   Wrist Extension  AROM;Left;20 reps;Seated    Wrist Radial Deviation  AAROM;Left;10 reps    Other wrist exercises  reisisted supination x 10 reps    Other wrist exercises  towel squeeze x 20; left      Electrical Stimulation   Electrical Stimulation Location  Lt wrist extensors     Electrical Stimulation Action  Russian    Electrical Stimulation Parameters  10/10 x 10 min    Electrical Stimulation Goals  Neuromuscular facilitation               PT Short Term Goals - 07/12/19 1237      PT SHORT TERM GOAL #1   Title  independent with initial HEP    Status  New  Target Date  08/09/19      PT SHORT TERM GOAL #2   Title  report pain < 4/10 with LUE for improved function    Status  New    Target Date  08/09/19      PT SHORT TERM GOAL #3   Title  improve Lt shoulder flexion and abduction by 15 deg AROM for improved function    Status  New    Target Date  08/09/19        PT Long Term Goals - 07/12/19 1238      PT LONG TERM GOAL #1   Title  independent with advanced HEP    Status  New    Target Date  09/06/19      PT LONG TERM GOAL #2   Title  Lt shoulder/elbow AROM WNL for without pain for improved function    Status  New    Target Date  09/06/19      PT LONG TERM GOAL #3   Title  Lt wrist extension AROM improved to at least 15 deg for improved strength and function    Status  New    Target Date  09/06/19      PT LONG TERM GOAL #4   Title  demonstrate at least 2/5 radial deviation and thumb extension for improved function    Status  New    Target Date  09/06/19            Plan - 08/12/19 1140    Clinical Impression Statement  Pt continues to demonstrate small and slow progress with wrist  exercises.  Will continue to benefit from PT to maximize function.    Personal Factors and Comorbidities  Other;Comorbidity 2   severity of injury   Comorbidities  hx breast cancer, anxiety, aneurysm    Examination-Activity Limitations  Bathing;Lift;Dressing;Hygiene/Grooming;Reach Overhead    Examination-Participation Restrictions  Yard Work;Meal Prep;Cleaning    Stability/Clinical Decision Making  Evolving/Moderate complexity    Rehab Potential  Good    PT Frequency  2x / week    PT Duration  8 weeks    PT Treatment/Interventions  ADLs/Self Care Home Management;Cryotherapy;Electrical Stimulation;Ultrasound;Moist Heat;Therapeutic activities;Therapeutic exercise;Patient/family education;Neuromuscular re-education;Manual techniques;Vasopneumatic Device;Taping;Dry needling;Passive range of motion    PT Next Visit Plan  review HEP, manual for ROM/flexibility, modalities for muscle activation    PT Home Exercise Plan  Access Code: [email or web address]    Consulted and Agree with Plan of Care  Patient       Patient will benefit from skilled therapeutic intervention in order to improve the following deficits and impairments:  Increased fascial restricitons, Increased muscle spasms, Pain, Postural dysfunction, Decreased range of motion, Decreased strength, Impaired UE functional use, Increased edema  Visit Diagnosis: Pain in left arm  Muscle weakness (generalized)  Other symptoms and signs involving the nervous system     Problem List Patient Active Problem List   Diagnosis Date Noted  . History of open reduction and internal fixation (ORIF) procedure 06/21/2019  . Closed displaced transverse fracture of shaft of left humerus 06/17/2019  . Breast cancer ([place]) 05/22/2005      Jaime Solomon, PT, DPT 08/12/19 11:41 AM     [facility] [address] [place], [place], [address] Phone: [phone]   Fax:  [phone]  Name: Jaime Solomon MRN:  [id number] Date of Birth: 31-Jul-1961

## 2019-08-17 ENCOUNTER — Encounter: Payer: Medicare Other | Admitting: Physical Therapy

## 2019-08-20 ENCOUNTER — Ambulatory Visit (INDEPENDENT_AMBULATORY_CARE_PROVIDER_SITE_OTHER): Payer: Medicare Other | Admitting: Physical Therapy

## 2019-08-20 ENCOUNTER — Encounter: Payer: Self-pay | Admitting: Physical Medicine and Rehabilitation

## 2019-08-20 ENCOUNTER — Ambulatory Visit (INDEPENDENT_AMBULATORY_CARE_PROVIDER_SITE_OTHER): Payer: Medicare Other | Admitting: Physical Medicine and Rehabilitation

## 2019-08-20 ENCOUNTER — Encounter: Payer: Self-pay | Admitting: Physical Therapy

## 2019-08-20 ENCOUNTER — Other Ambulatory Visit: Payer: Self-pay

## 2019-08-20 DIAGNOSIS — R531 Weakness: Secondary | ICD-10-CM

## 2019-08-20 DIAGNOSIS — M6281 Muscle weakness (generalized): Secondary | ICD-10-CM

## 2019-08-20 DIAGNOSIS — R202 Paresthesia of skin: Secondary | ICD-10-CM

## 2019-08-20 DIAGNOSIS — R29818 Other symptoms and signs involving the nervous system: Secondary | ICD-10-CM | POA: Diagnosis not present

## 2019-08-20 DIAGNOSIS — M79602 Pain in left arm: Secondary | ICD-10-CM

## 2019-08-20 NOTE — Progress Notes (Signed)
 .  Numeric Pain Rating Scale and Functional Assessment Average Pain 8   In the last MONTH (on 0-10 scale) has pain interfered with the following?  1. General activity like being  able to carry out your everyday physical activities such as walking, climbing stairs, carrying groceries, or moving a chair?  Rating(8)   

## 2019-08-20 NOTE — Therapy (Addendum)
[place] [place], [place], [address] Phone: [phone]   Fax:  [phone]  Physical Therapy Treatment/Discharge Summary  Patient Details  Name: Jaime Solomon MRN: [id number] Date of Birth: 1961/02/08 Referring Provider (PT): Leandrew Koyanagi, MD   Encounter Date: 08/20/2019  PT End of Session - 08/20/19 0842    Visit Number  7    Number of Visits  24    Date for PT Re-Evaluation  09/06/19    PT Start Time  0801    PT Stop Time  0841    PT Time Calculation (min)  40 min    Activity Tolerance  Patient tolerated treatment well    Behavior During Therapy  [facility] for tasks assessed/performed       Past Medical History:  Diagnosis Date  . Anxiety   . Breast cancer ([place]) 2006  . Breast cancer ([place]) 05/22/2012  . GERD (gastroesophageal reflux disease)   . Headache   . Personal history of radiation therapy     Past Surgical History:  Procedure Laterality Date  . angiography  2010   head  . BREAST LUMPECTOMY Right 2006  . CHOLECYSTECTOMY    . MASTECTOMY PARTIAL / LUMPECTOMY  2006   right   . ORIF HUMERUS FRACTURE Left 06/21/2019   Procedure: OPEN REDUCTION INTERNAL FIXATION (ORIF) LEFT HUMERAL SHAFT FRACTURE;  Surgeon: Leandrew Koyanagi, MD;  Location: [place];  Service: Orthopedics;  Laterality: Left;  . PARTIAL HYSTERECTOMY  1991  . ROTATOR CUFF REPAIR Right     There were no vitals filed for this visit.  Subjective Assessment - 08/20/19 0801    Subjective  forgot to wear brace this morning; no changes in function at this time.  feels pain is a little better.    Patient Stated Goals  improve function in wrist    Currently in Pain?  Yes    Pain Score  5     Pain Location  Arm    Pain Orientation  Left;Upper    Pain Descriptors / Indicators  Aching    Pain Type  Acute pain;Surgical pain    Pain Onset  1 to 4 weeks ago    Pain Frequency  Intermittent    Aggravating Factors   movement    Pain Relieving Factors  rest,  medication                       [facility] Adult PT Treatment/Exercise - 08/20/19 0001      Hand Exercises   Other Hand Exercises  A-Z sign language, left      Wrist Exercises   Wrist Extension  AROM;Left;20 reps;Seated    Wrist Radial Deviation  AAROM;Left;10 reps      Manual Therapy   Manual Therapy  Soft tissue mobilization    Soft tissue mobilization  STM and IASTM to Lt biceps       Trigger Point Dry Needling - 08/20/19 0828    Consent Given?  Yes    Education Handout Provided  Yes    Muscles Treated Upper Quadrant  Biceps    Biceps Response  Palpable increased muscle length;Twitch response elicited           PT Education - 08/20/19 0840    Education Details  alphabet sign language, DN    Person(s) Educated  Patient    Methods  Explanation;Demonstration;Handout       PT Short Term Goals - 08/20/19 [phone]  PT SHORT TERM GOAL #1   Title  independent with initial HEP    Status  Achieved    Target Date  08/09/19      PT SHORT TERM GOAL #2   Title  report pain < 4/10 with LUE for improved function    Status  On-going    Target Date  08/09/19      PT SHORT TERM GOAL #3   Title  improve Lt shoulder flexion and abduction by 15 deg AROM for improved function    Baseline  12/18: will measure next visit; met by visual observation    Status  Achieved    Target Date  08/09/19        PT Long Term Goals - 07/12/19 1238      PT LONG TERM GOAL #1   Title  independent with advanced HEP    Status  New    Target Date  09/06/19      PT LONG TERM GOAL #2   Title  Lt shoulder/elbow AROM WNL for without pain for improved function    Status  New    Target Date  09/06/19      PT LONG TERM GOAL #3   Title  Lt wrist extension AROM improved to at least 15 deg for improved strength and function    Status  New    Target Date  09/06/19      PT LONG TERM GOAL #4   Title  demonstrate at least 2/5 radial deviation and thumb extension for improved function     Status  New    Target Date  09/06/19            Plan - 08/20/19 0843    Clinical Impression Statement  Pt tolerated session well, with DN performed to Lt biceps with decreased active trigger points following session.  Will continue to benefit from PT to maximize function.    Personal Factors and Comorbidities  Other;Comorbidity 2   severity of injury   Comorbidities  hx breast cancer, anxiety, aneurysm    Examination-Activity Limitations  Bathing;Lift;Dressing;Hygiene/Grooming;Reach Overhead    Examination-Participation Restrictions  Yard Work;Meal Prep;Cleaning    Stability/Clinical Decision Making  Evolving/Moderate complexity    Rehab Potential  Good    PT Frequency  2x / week    PT Duration  8 weeks    PT Treatment/Interventions  ADLs/Self Care Home Management;Cryotherapy;Electrical Stimulation;Ultrasound;Moist Heat;Therapeutic activities;Therapeutic exercise;Patient/family education;Neuromuscular re-education;Manual techniques;Vasopneumatic Device;Taping;Dry needling;Passive range of motion    PT Next Visit Plan  review HEP, manual for ROM/flexibility, modalities for muscle activation    PT Home Exercise Plan  Access Code: [email or web address]    Consulted and Agree with Plan of Care  Patient       Patient will benefit from skilled therapeutic intervention in order to improve the following deficits and impairments:  Increased fascial restricitons, Increased muscle spasms, Pain, Postural dysfunction, Decreased range of motion, Decreased strength, Impaired UE functional use, Increased edema  Visit Diagnosis: Pain in left arm  Muscle weakness (generalized)  Other symptoms and signs involving the nervous system     Problem List Patient Active Problem List   Diagnosis Date Noted  . History of open reduction and internal fixation (ORIF) procedure 06/21/2019  . Closed displaced transverse fracture of shaft of left humerus 06/17/2019  . Breast cancer ([place]) 05/22/2005       Laureen Abrahams, PT, DPT 08/20/19 8:44 AM     [facility] [address]  [place], [place], [address] Phone: [phone]   Fax:  [phone]  Name: Jaime Solomon MRN: [id number] Date of Birth: [birth year]     PHYSICAL THERAPY DISCHARGE SUMMARY  Visits from Start of Care: 7  Current functional level related to goals / functional outcomes: See above   Remaining deficits: See above   Education / Equipment: HEP  Plan: Patient agrees to discharge.  Patient goals were not met. Patient is being discharged due to financial reasons.  ?????    Spoke with pt on the phone, she reports she cannot afford PT at this time, requesting d/c. Laureen Abrahams, PT, DPT 09/16/19 9:58 AM [facility] [address] [place], [place], [address] Phone: [phone]   Fax:  [phone]

## 2019-08-20 NOTE — Patient Instructions (Signed)

## 2019-08-23 ENCOUNTER — Encounter: Payer: Medicare Other | Admitting: Physical Therapy

## 2019-08-23 NOTE — Procedures (Signed)
EMG & NCV Findings: Evaluation of the left median motor nerve showed prolonged distal onset latency (5.3 ms), reduced amplitude (3.6 mV), and decreased conduction velocity (Elbow-Wrist, 45 m/s).  The left radial motor nerve showed prolonged distal onset latency (3.0 ms), reduced amplitude (1.0 mV), and decreased conduction velocity (Up Arm-8cm, 41 m/s).  The left median (across palm) sensory nerve showed prolonged distal peak latency (Wrist, 5.1 ms).  The left radial sensory nerve showed no response (Wrist).  All remaining nerves (as indicated in the following tables) were within normal limits.    Needle evaluation of the left extensor indicis and the left extensor digitorum communis muscles showed increased insertional activity, widespread spontaneous activity, increased motor unit amplitude, moderately increased polyphasic potentials, and diminished recruitment.  The left brachioradialis muscle showed increased insertional activity, widespread spontaneous activity, and diminished recruitment.  The left triceps muscle showed increased insertional activity, increased spontaneous activity, increased motor unit amplitude, and diminished recruitment.  The left extensor pollicis longus muscle showed increased insertional activity, widespread spontaneous activity, increased motor unit amplitude, and diminished recruitment.  All remaining muscles (as indicated in the following table) showed no evidence of electrical instability.    Impression: The above electrodiagnostic study is ABNORMAL and reveals evidence of:  1.  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  The lesion is characterized by demyelination and axonal injury.  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 There has been more recovery distally than proximally with evidence of fiber sprouting and reinnervation in the distal radial nerve muscles.   The presence of motor unit action potentials on needle EMG portend a better outcome for recovery.  As below would suggest repeat testing in 3 to 4 months depending on how well recovery is taking place.  2. A moderate to severe left median nerve entrapment at the wrist (carpal tunnel syndrome) affecting sensory and motor components.  By clinical history and mechanism of injury this does not seem to be related to the humerus fracture.  She may indeed have entrapment on the right but this was not tested today.  There is no significant electrodiagnostic evidence of any other focal nerve entrapment, brachial plexopathy or cervical radiculopathy.   Recommendations: 1.  Follow-up with referring physician. 2.  Continue current management of symptoms. 3.  Suggest surgical evaluation for left carpal tunnel. May want to test right if increased symptoms. 4.  Repeat studies in 3-4 months if symptoms are progressive or not resolved.  ___________________________ Laurence Spates FAAPMR Board Certified, American Board of Physical Medicine and Rehabilitation    Nerve Conduction Studies Anti Sensory Summary Table   Stim Site NR Peak (ms) Norm Peak (ms) P-T Amp (V) Norm P-T Amp Site1 Site2 Delta-P (ms) Dist (cm) Vel (m/s) Norm Vel (m/s)  Left Median Acr Palm Anti Sensory (2nd Digit)  31.5C  Wrist    *5.1 <3.6 17.4 >10 Wrist Palm 3.1 0.0    Palm    2.0 <2.0 16.5         Left Radial Anti Sensory (Base 1st Digit)  30.6C  Wrist *NR  <3.1   Wrist Base 1st Digit  0.0    Left Ulnar Anti Sensory (5th Digit)  31.8C  Wrist    3.6 <3.7 20.8 >15.0 Wrist 5th Digit 3.6 14.0 39 >38   Motor Summary Table   Stim Site NR Onset (ms) Norm Onset (ms) O-P Amp (mV) Norm O-P Amp Site1  Site2 Delta-0 (ms) Dist (cm) Vel (m/s) Norm Vel (m/s)  Left Median Motor (Abd Poll Brev)  31C  Wrist    *5.3 <4.2 *3.6 >5 Elbow Wrist 4.2 18.8 *45 >50  Elbow    9.5  1.7         Left Radial Motor (Ext Indicis)  31.9C  8cm    *3.0 <2.5 *1.0  >1.7 Up Arm 8cm 4.8 19.5 *41 >60  Up Arm    7.8  0.2         Left Ulnar Motor (Abd Dig Min)  31.2C  Wrist    3.0 <4.2 7.1 >3 B Elbow Wrist 3.0 17.0 57 >53  B Elbow    6.0  7.2  A Elbow B Elbow 1.6 10.0 63 >53  A Elbow    7.6  6.9          EMG   Side Muscle Nerve Root Ins Act Fibs Psw Amp Dur Poly Recrt Int Fraser Din Comment  Left Abd Poll Brev Median C8-T1 Nml Nml Nml Nml Nml 0 Nml Nml   Left 1stDorInt Ulnar C8-T1 Nml Nml Nml Nml Nml 0 Nml Nml   Left ExtIndicis Radial (Post Int) C7-8 *Incr *4+ *4+ *Incr Nml *2+ *Reduced Nml   Left ExtDigCom   *Incr *4+ *4+ *Incr Nml *2+ *Reduced Nml   Left BrachioRad Radial C5-6 *Incr *4+ *4+ Nml Nml 0 *Reduced Nml very few muap  Left Triceps Radial C6-7-8 *Incr *3+ *3+ *Incr Nml 0 *Reduced Nml very few muap  Left Deltoid Axillary C5-6 Nml Nml Nml Nml Nml 0 Nml Nml   Left Ext Poll Long Radial (Post Int) C7-8 *Incr *4+ *4+ *Incr Nml 0 *Reduced Nml     Nerve Conduction Studies Anti Sensory Left/Right Comparison   Stim Site L Lat (ms) R Lat (ms) L-R Lat (ms) L Amp (V) R Amp (V) L-R Amp (%) Site1 Site2 L Vel (m/s) R Vel (m/s) L-R Vel (m/s)  Median Acr Palm Anti Sensory (2nd Digit)  31.5C  Wrist *5.1   17.4   Wrist Palm     Palm 2.0   16.5         Radial Anti Sensory (Base 1st Digit)  30.6C  Wrist       Wrist Base 1st Digit     Ulnar Anti Sensory (5th Digit)  31.8C  Wrist 3.6   20.8   Wrist 5th Digit 39     Motor Left/Right Comparison   Stim Site L Lat (ms) R Lat (ms) L-R Lat (ms) L Amp (mV) R Amp (mV) L-R Amp (%) Site1 Site2 L Vel (m/s) R Vel (m/s) L-R Vel (m/s)  Median Motor (Abd Poll Brev)  31C  Wrist *5.3   *3.6   Elbow Wrist *45    Elbow 9.5   1.7         Radial Motor (Ext Indicis)  31.9C  8cm *3.0   *1.0   Up Arm 8cm *41    Up Arm 7.8   0.2         Ulnar Motor (Abd Dig Min)  31.2C  Wrist 3.0   7.1   B Elbow Wrist 57    B Elbow 6.0   7.2   A Elbow B Elbow 63    A Elbow 7.6   6.9            Waveforms:

## 2019-08-23 NOTE — Progress Notes (Signed)
Jaime Solomon - 58 y.o. female MRN [id number]  Date of birth: [birth year]  Office Visit Note: Visit Date: 08/20/2019 PCP: Shirline Frees, MD Referred by: Shirline Frees, MD  Subjective: Chief Complaint  Patient presents with  . Left Shoulder - Pain, Numbness, Weakness  . Left Arm - Pain, Numbness, Weakness  . Left Hand - Pain, Numbness, Weakness   HPI: Jaime Solomon is a 58 y.o. female who comes in today At the request of Dr. Eduard Roux for electrodiagnostic study of the left upper limb.  Patient is right-hand dominant.  She is status post closed displaced transverse fracture of the shaft of the left humerus on 06/15/2019 and is status post ORIF by Dr. Erlinda Hong on 06/21/2019.  She reports significant weakness since the fracture and surgery.  She reports pain in the left shoulder and left arm and left hand.  She actually reports more numbness in the thumb and index finger on the left particularly when trying to hold objects in region do things with them.  She is holding her arm down quite a bit she has some weakness with elbow flexion extension.  She has difficulty with extension of the wrist and picking up things.  Wearing the brace helps with some of her symptoms.  She has been in physical therapy and has noticed some recovery of motor function.  She has no right-sided complaints.  No radicular pain.  No prior electrodiagnostic studies.  By clinical history she has had intermittent off and on numbness and tingling of both hands particularly at night and with activities.  No frank diagnosis of carpal tunnel syndrome in the past.  Her case is complicated by history of breast cancer.  She has also undergone significant lumbar spine surgery.  The surgery was performed by Dr. Rennis Harding.  ROS Otherwise per HPI.  Assessment & Plan: Visit Diagnoses:  1. Paresthesia of skin   2. Weakness     Plan: Impression: The above electrodiagnostic study is ABNORMAL and reveals evidence of:  1.  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  The lesion is characterized by demyelination and axonal injury.  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 There has been more recovery distally than proximally with evidence of fiber sprouting and reinnervation in the distal radial nerve muscles.  The presence of motor unit action potentials on needle EMG portend a better outcome for recovery.  As below would suggest repeat testing in 3 to 4 months depending on how well recovery is taking place.  2. A moderate to severe left median nerve entrapment at the wrist (carpal tunnel syndrome) affecting sensory and motor components.  By clinical history and mechanism of injury this does not seem to be related to the humerus fracture.  She may indeed have entrapment on the right but this was not tested today.  There is no significant electrodiagnostic evidence of any other focal nerve entrapment, brachial plexopathy or cervical radiculopathy.   Recommendations: 1.  Follow-up with referring physician. 2.  Continue current management of symptoms. 3.  Suggest surgical evaluation for left carpal tunnel. May want to test right if increased symptoms. 4.  Repeat studies in 3-4 months if symptoms are progressive or not resolved.   Meds & Orders: No orders of the defined types were placed in this encounter.   Orders Placed This Encounter  Procedures  . NCV with EMG (electromyography)    Follow-up:  Return for  Eduard Roux, M.D..   Procedures: No procedures performed  EMG & NCV Findings: Evaluation of the left median motor nerve showed prolonged distal onset latency (5.3 ms), reduced amplitude (3.6 mV), and decreased conduction velocity (Elbow-Wrist, 45 m/s).  The left radial motor nerve showed prolonged distal onset latency (3.0 ms), reduced amplitude (1.0 mV), and decreased conduction velocity (Up Arm-8cm, 41 m/s).  The left  median (across palm) sensory nerve showed prolonged distal peak latency (Wrist, 5.1 ms).  The left radial sensory nerve showed no response (Wrist).  All remaining nerves (as indicated in the following tables) were within normal limits.    Needle evaluation of the left extensor indicis and the left extensor digitorum communis muscles showed increased insertional activity, widespread spontaneous activity, increased motor unit amplitude, moderately increased polyphasic potentials, and diminished recruitment.  The left brachioradialis muscle showed increased insertional activity, widespread spontaneous activity, and diminished recruitment.  The left triceps muscle showed increased insertional activity, increased spontaneous activity, increased motor unit amplitude, and diminished recruitment.  The left extensor pollicis longus muscle showed increased insertional activity, widespread spontaneous activity, increased motor unit amplitude, and diminished recruitment.  All remaining muscles (as indicated in the following table) showed no evidence of electrical instability.    Impression: The above electrodiagnostic study is ABNORMAL and reveals evidence of:  1.  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  The lesion is characterized by demyelination and axonal injury.  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 There has been more recovery distally than proximally with evidence of fiber sprouting and reinnervation in the distal radial nerve muscles.  The presence of motor unit action potentials on needle EMG portend a better outcome for recovery.  As below would suggest repeat testing in 3 to 4 months depending on how well recovery is taking place.  2. A moderate to severe left median nerve entrapment at the wrist (carpal tunnel syndrome) affecting sensory and motor components.  By clinical history and  mechanism of injury this does not seem to be related to the humerus fracture.  She may indeed have entrapment on the right but this was not tested today.  There is no significant electrodiagnostic evidence of any other focal nerve entrapment, brachial plexopathy or cervical radiculopathy.   Recommendations: 1.  Follow-up with referring physician. 2.  Continue current management of symptoms. 3.  Suggest surgical evaluation for left carpal tunnel. May want to test right if increased symptoms. 4.  Repeat studies in 3-4 months if symptoms are progressive or not resolved.  ___________________________ Laurence Spates FAAPMR Board Certified, American Board of Physical Medicine and Rehabilitation    Nerve Conduction Studies Anti Sensory Summary Table   Stim Site NR Peak (ms) Norm Peak (ms) P-T Amp (V) Norm P-T Amp Site1 Site2 Delta-P (ms) Dist (cm) Vel (m/s) Norm Vel (m/s)  Left Median Acr Palm Anti Sensory (2nd Digit)  31.5C  Wrist    *5.1 <3.6 17.4 >10 Wrist Palm 3.1 0.0    Palm    2.0 <2.0 16.5         Left Radial Anti Sensory (Base 1st Digit)  30.6C  Wrist *NR  <3.1   Wrist Base 1st Digit  0.0    Left Ulnar Anti Sensory (5th Digit)  31.8C  Wrist    3.6 <3.7 20.8 >15.0 Wrist 5th Digit 3.6 14.0 39 >38   Motor Summary Table   Stim  Site NR Onset (ms) Norm Onset (ms) O-P Amp (mV) Norm O-P Amp Site1 Site2 Delta-0 (ms) Dist (cm) Vel (m/s) Norm Vel (m/s)  Left Median Motor (Abd Poll Brev)  31C  Wrist    *5.3 <4.2 *3.6 >5 Elbow Wrist 4.2 18.8 *45 >50  Elbow    9.5  1.7         Left Radial Motor (Ext Indicis)  31.9C  8cm    *3.0 <2.5 *1.0 >1.7 Up Arm 8cm 4.8 19.5 *41 >60  Up Arm    7.8  0.2         Left Ulnar Motor (Abd Dig Min)  31.2C  Wrist    3.0 <4.2 7.1 >3 B Elbow Wrist 3.0 17.0 57 >53  B Elbow    6.0  7.2  A Elbow B Elbow 1.6 10.0 63 >53  A Elbow    7.6  6.9          EMG   Side Muscle Nerve Root Ins Act Fibs Psw Amp Dur Poly Recrt Int Fraser Din Comment  Left Abd Poll Brev Median  C8-T1 Nml Nml Nml Nml Nml 0 Nml Nml   Left 1stDorInt Ulnar C8-T1 Nml Nml Nml Nml Nml 0 Nml Nml   Left ExtIndicis Radial (Post Int) C7-8 *Incr *4+ *4+ *Incr Nml *2+ *Reduced Nml   Left ExtDigCom   *Incr *4+ *4+ *Incr Nml *2+ *Reduced Nml   Left BrachioRad Radial C5-6 *Incr *4+ *4+ Nml Nml 0 *Reduced Nml very few muap  Left Triceps Radial C6-7-8 *Incr *3+ *3+ *Incr Nml 0 *Reduced Nml very few muap  Left Deltoid Axillary C5-6 Nml Nml Nml Nml Nml 0 Nml Nml   Left Ext Poll Long Radial (Post Int) C7-8 *Incr *4+ *4+ *Incr Nml 0 *Reduced Nml     Nerve Conduction Studies Anti Sensory Left/Right Comparison   Stim Site L Lat (ms) R Lat (ms) L-R Lat (ms) L Amp (V) R Amp (V) L-R Amp (%) Site1 Site2 L Vel (m/s) R Vel (m/s) L-R Vel (m/s)  Median Acr Palm Anti Sensory (2nd Digit)  31.5C  Wrist *5.1   17.4   Wrist Palm     Palm 2.0   16.5         Radial Anti Sensory (Base 1st Digit)  30.6C  Wrist       Wrist Base 1st Digit     Ulnar Anti Sensory (5th Digit)  31.8C  Wrist 3.6   20.8   Wrist 5th Digit 39     Motor Left/Right Comparison   Stim Site L Lat (ms) R Lat (ms) L-R Lat (ms) L Amp (mV) R Amp (mV) L-R Amp (%) Site1 Site2 L Vel (m/s) R Vel (m/s) L-R Vel (m/s)  Median Motor (Abd Poll Brev)  31C  Wrist *5.3   *3.6   Elbow Wrist *45    Elbow 9.5   1.7         Radial Motor (Ext Indicis)  31.9C  8cm *3.0   *1.0   Up Arm 8cm *41    Up Arm 7.8   0.2         Ulnar Motor (Abd Dig Min)  31.2C  Wrist 3.0   7.1   B Elbow Wrist 57    B Elbow 6.0   7.2   A Elbow B Elbow 63    A Elbow 7.6   6.9            Waveforms:  Clinical History: No specialty comments available.   She reports that she quit smoking about 14 years ago. She has never used smokeless tobacco. No results for input(s): HGBA1C, LABURIC in the last 8760 hours.  Objective:  VS:  HT:    WT:   BMI:     BP:   HR: bpm  TEMP: ( )  RESP:  Physical Exam Musculoskeletal:        General: No swelling, tenderness or  deformity.     Comments: Inspection reveals well-healed surgical scar in the proximal anterior left upper arm and there is no atrophy of the bilateral APB or FDI or hand intrinsics. There is mild swelling but no color changes, allodynia or dystrophic changes in the left upper extremity. There is a negative Hoffmann's test bilaterally.  Patient can flex at the elbow but there is some weakness particularly with hand pronated.  There is ulnar deviation with extension.  She does have extension of the fingers.  She has weakness with tricep extension.  Her sensation is interesting from a clinical standpoint and she tells me to light touch that she seems to have more sensation over the dorsum of the hand than the ulnar side of the hand or the median nerve side of the hand.  She does seem to have some decreased sensation in the median nerve distribution on the left.  Skin:    General: Skin is warm and dry.     Findings: No erythema or rash.  Neurological:     Mental Status: She is alert and oriented to person, place, and time.     Sensory: Sensory deficit present.     Motor: Weakness present. No abnormal muscle tone.     Coordination: Coordination normal.     Gait: Gait normal.  Psychiatric:        Mood and Affect: Mood normal.        Behavior: Behavior normal.     Ortho Exam Imaging: No results found.  Past Medical/Family/Surgical/Social History: Medications & Allergies reviewed per EMR, new medications updated. Patient Active Problem List   Diagnosis Date Noted  . History of open reduction and internal fixation (ORIF) procedure 06/21/2019  . Closed displaced transverse fracture of shaft of left humerus 06/17/2019  . Breast cancer ([place]) 05/22/2005   Past Medical History:  Diagnosis Date  . Anxiety   . Breast cancer ([place]) 2006  . Breast cancer ([place]) 05/22/2012  . GERD (gastroesophageal reflux disease)   . Headache   . Personal history of radiation therapy    Family History  Problem  Relation Age of Onset  . Diabetes Father   . Hypertension Father    Past Surgical History:  Procedure Laterality Date  . angiography  2010   head  . BREAST LUMPECTOMY Right 2006  . CHOLECYSTECTOMY    . MASTECTOMY PARTIAL / LUMPECTOMY  2006   right   . ORIF HUMERUS FRACTURE Left 06/21/2019   Procedure: OPEN REDUCTION INTERNAL FIXATION (ORIF) LEFT HUMERAL SHAFT FRACTURE;  Surgeon: Leandrew Koyanagi, MD;  Location: [facility];  Service: Orthopedics;  Laterality: Left;  . PARTIAL HYSTERECTOMY  1991  . ROTATOR CUFF REPAIR Right    Social History   Occupational History  . Not on file  Tobacco Use  . Smoking status: Former Smoker    Quit date: 05/22/2005    Years since quitting: 14.2  . Smokeless tobacco: Never Used  Substance and Sexual Activity  . Alcohol use: Yes    Alcohol/week: 2.0  standard drinks    Types: 2 Glasses of wine per week  . Drug use: No  . Sexual activity: Not Currently

## 2019-08-25 ENCOUNTER — Encounter: Payer: Medicare Other | Admitting: Physical Therapy

## 2019-08-30 ENCOUNTER — Encounter: Payer: Medicare Other | Admitting: Physical Therapy

## 2019-09-01 ENCOUNTER — Encounter: Payer: Medicare Other | Admitting: Physical Therapy

## 2019-09-14 ENCOUNTER — Ambulatory Visit (INDEPENDENT_AMBULATORY_CARE_PROVIDER_SITE_OTHER): Payer: Medicare Other

## 2019-09-14 ENCOUNTER — Ambulatory Visit (INDEPENDENT_AMBULATORY_CARE_PROVIDER_SITE_OTHER): Payer: Medicare Other | Admitting: Orthopaedic Surgery

## 2019-09-14 ENCOUNTER — Other Ambulatory Visit: Payer: Self-pay

## 2019-09-14 DIAGNOSIS — S42322A Displaced transverse fracture of shaft of humerus, left arm, initial encounter for closed fracture: Secondary | ICD-10-CM

## 2019-09-14 NOTE — Progress Notes (Signed)
Post-Op Visit Note   Patient: Jaime Solomon           Date of Birth: 1961/06/07           MRN: [id number] Visit Date: 09/14/2019 PCP: Shirline Frees, MD   Assessment & Plan:  Chief Complaint:  Chief Complaint  Patient presents with  . Left Shoulder - Injury   Visit Diagnoses:  1. Closed displaced transverse fracture of shaft of left humerus, initial encounter     Plan: Raevin is nearly 3 months status post ORIF left transverse humerus fracture.  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  The radial nerve was explored during surgery and was in continuity.  She returns today for fracture follow-up and nerve conduction study follow-up.  In the meantime she has regained her ability to extend her wrist and her fingers.  She still does not have thumb extension function.  She does feels numbness in her left thumb.  She also endorses bilateral carpal tunnel symptoms that she has had for years.  Surgical scar is fully healed.  She has normal range of motion of the shoulder and elbow and wrist.  Her joints are supple.  She is able to extend her wrist with slight ulnar deviation.  She has positive finger extension strength 4/5.  She is still unable to activate EPL.  Her x-rays demonstrate significant healing of the fracture abundant callus formation and bony consolidation.  Nerve conduction studies and x-rays were reviewed with the patient in detail today.  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  She has had significant improvement in wrist extension and finger extension since I last saw her.  She will continue with home exercises to keep her joints supple and strong.  I would like to recheck her in 6 weeks with two-view x-rays of the left humerus and to recheck her radial nerve function.  We provided her with a left carpal tunnel brace to wear.  Follow-Up Instructions: Return in about 6 weeks (around 10/26/2019).   Orders:  Orders Placed This Encounter  Procedures  . XR Humerus Left   No  orders of the defined types were placed in this encounter.   Imaging: XR Humerus Left  Result Date: 09/14/2019 Stable fixation alignment of the humerus fracture without complication.  There is abundant callus formation and bony consolidation.   PMFS History: Patient Active Problem List   Diagnosis Date Noted  . History of open reduction and internal fixation (ORIF) procedure 06/21/2019  . Closed displaced transverse fracture of shaft of left humerus 06/17/2019  . Breast cancer ([place]) 05/22/2005   Past Medical History:  Diagnosis Date  . Anxiety   . Breast cancer ([place]) 2006  . Breast cancer ([place]) 05/22/2012  . GERD (gastroesophageal reflux disease)   . Headache   . Personal history of radiation therapy     Family History  Problem Relation Age of Onset  . Diabetes Father   . Hypertension Father     Past Surgical History:  Procedure Laterality Date  . angiography  2010   head  . BREAST LUMPECTOMY Right 2006  . CHOLECYSTECTOMY    . MASTECTOMY PARTIAL / LUMPECTOMY  2006   right   . ORIF HUMERUS FRACTURE Left 06/21/2019   Procedure: OPEN REDUCTION INTERNAL FIXATION (ORIF) LEFT HUMERAL SHAFT FRACTURE;  Surgeon: Leandrew Koyanagi, MD;  Location: [facility];  Service: Orthopedics;  Laterality: Left;  . PARTIAL HYSTERECTOMY  1991  . ROTATOR CUFF REPAIR Right    Social  History   Occupational History  . Not on file  Tobacco Use  . Smoking status: Former Smoker    Quit date: 05/22/2005    Years since quitting: 14.3  . Smokeless tobacco: Never Used  Substance and Sexual Activity  . Alcohol use: Yes    Alcohol/week: 2.0 standard drinks    Types: 2 Glasses of wine per week  . Drug use: No  . Sexual activity: Not Currently

## 2019-09-16 ENCOUNTER — Encounter: Payer: Medicare Other | Admitting: Physical Therapy

## 2019-09-17 ENCOUNTER — Encounter: Payer: Medicare Other | Admitting: Physical Therapy

## 2019-12-05 ENCOUNTER — Emergency Department (HOSPITAL_COMMUNITY): Payer: Medicare Other

## 2019-12-05 ENCOUNTER — Encounter (HOSPITAL_COMMUNITY): Payer: Self-pay | Admitting: Emergency Medicine

## 2019-12-05 ENCOUNTER — Emergency Department (HOSPITAL_COMMUNITY)
Admission: EM | Admit: 2019-12-05 | Discharge: 2019-12-05 | Disposition: A | Discharge status: Home / Self Care | Payer: Medicare Other | Attending: Emergency Medicine | Admitting: Emergency Medicine

## 2019-12-05 ENCOUNTER — Other Ambulatory Visit: Payer: Self-pay

## 2019-12-05 DIAGNOSIS — Y9389 Activity, other specified: Secondary | ICD-10-CM | POA: Insufficient documentation

## 2019-12-05 DIAGNOSIS — Z79899 Other long term (current) drug therapy: Secondary | ICD-10-CM | POA: Diagnosis not present

## 2019-12-05 DIAGNOSIS — S8991XA Unspecified injury of right lower leg, initial encounter: Secondary | ICD-10-CM | POA: Diagnosis present

## 2019-12-05 DIAGNOSIS — Y999 Unspecified external cause status: Secondary | ICD-10-CM | POA: Diagnosis not present

## 2019-12-05 DIAGNOSIS — S8391XA Sprain of unspecified site of right knee, initial encounter: Secondary | ICD-10-CM | POA: Insufficient documentation

## 2019-12-05 DIAGNOSIS — W108XXA Fall (on) (from) other stairs and steps, initial encounter: Secondary | ICD-10-CM | POA: Insufficient documentation

## 2019-12-05 DIAGNOSIS — Y92008 Other place in unspecified non-institutional (private) residence as the place of occurrence of the external cause: Secondary | ICD-10-CM | POA: Insufficient documentation

## 2019-12-05 DIAGNOSIS — Z87891 Personal history of nicotine dependence: Secondary | ICD-10-CM | POA: Diagnosis not present

## 2019-12-05 MED ORDER — HYDROCODONE-ACETAMINOPHEN 5-325 MG PO TABS: 1.0000 | ORAL_TABLET | Freq: Four times a day (QID) | ORAL | 0 refills | Status: DC | PRN

## 2019-12-05 NOTE — ED Triage Notes (Signed)
Patient here from home reporting fall Friday. Fall to right knee. No deformities noted. Reports difficulty walking.

## 2019-12-05 NOTE — Discharge Instructions (Signed)
Use your walker as often as you need to if it is painful to walk.  Continue to ice and elevate.  Wear the knee sleeve as needed for compression and support.  If you are still having pain within 1 week you need to follow-up with Dr. Erlinda Hong.

## 2019-12-05 NOTE — ED Provider Notes (Signed)
[facility] DEPT Provider Note   CSN: [id number] Arrival date & time: 12/05/19  1119     History Chief Complaint  Patient presents with  . Fall  . Knee Pain    Katana Kisler is a 59 y.o. female.  The history is provided by the patient.  Fall This is a new (Patient reports 2 days ago she was going from her kitchen to her carport where there was a large step down and she misstepped causing her right knee to suddenly bend forward and jerked but she did not hit the ground.  She was able to catch herself.) problem. The current episode started 2 days ago. The problem occurs constantly. The problem has been gradually worsening. Associated symptoms comments: Right knee pain. The symptoms are aggravated by walking. The symptoms are relieved by rest. She has tried rest (nsaids, ice and elevation) for the symptoms. The treatment provided no relief.  Knee Pain      Past Medical History:  Diagnosis Date  . Anxiety   . Breast cancer ([place]) 2006  . Breast cancer ([place]) 05/22/2012  . GERD (gastroesophageal reflux disease)   . Headache   . Personal history of radiation therapy     Patient Active Problem List   Diagnosis Date Noted  . History of open reduction and internal fixation (ORIF) procedure 06/21/2019  . Closed displaced transverse fracture of shaft of left humerus 06/17/2019  . Breast cancer ([place]) 05/22/2005    Past Surgical History:  Procedure Laterality Date  . angiography  2010   head  . BREAST LUMPECTOMY Right 2006  . CHOLECYSTECTOMY    . MASTECTOMY PARTIAL / LUMPECTOMY  2006   right   . ORIF HUMERUS FRACTURE Left 06/21/2019   Procedure: OPEN REDUCTION INTERNAL FIXATION (ORIF) LEFT HUMERAL SHAFT FRACTURE;  Surgeon: Leandrew Koyanagi, MD;  Location: [facility];  Service: Orthopedics;  Laterality: Left;  . PARTIAL HYSTERECTOMY  1991  . ROTATOR CUFF REPAIR Right      OB History   No obstetric history on file.     Family History  Problem Relation  Age of Onset  . Diabetes Father   . Hypertension Father     Social History   Tobacco Use  . Smoking status: Former Smoker    Quit date: 05/22/2005    Years since quitting: 14.5  . Smokeless tobacco: Never Used  Substance Use Topics  . Alcohol use: Yes    Alcohol/week: 2.0 standard drinks    Types: 2 Glasses of wine per week  . Drug use: No    Home Medications Prior to Admission medications   Medication Sig Start Date End Date Taking? Authorizing Provider  ALPRAZolam Duanne Moron) 0.5 MG tablet Take 0.5 mg by mouth at bedtime as needed for sleep.     [provider]  buPROPion (WELLBUTRIN XL) 300 MG 24 hr tablet Take 300 mg by mouth daily.    [provider]  calcium-vitamin D (OSCAL WITH D) 500-200 MG-UNIT tablet Take 1 tablet by mouth 3 (three) times daily. 06/22/19   Aundra Dubin, PA-C  citalopram (CELEXA) 10 MG tablet Take 10 mg by mouth daily.    [provider]  diphenhydrAMINE (SOMINEX) 25 MG tablet Take 50 mg by mouth at bedtime as needed for sleep.     [provider]  gabapentin (NEURONTIN) 300 MG capsule Take 2 capsules by mouth at bedtime.     [provider]  HYDROcodone-acetaminophen (NORCO) 5-325 MG tablet Take  1 tablet by mouth every 6 (six) hours as needed for severe pain. 12/05/19   Blanchie Dessert, MD  ketorolac (TORADOL) 10 MG tablet Take 1 tablet (10 mg total) by mouth 2 (two) times daily as needed. 06/21/19   Leandrew Koyanagi, MD  nortriptyline (PAMELOR) 50 MG capsule Take 150 mg by mouth at bedtime.    [provider]  ondansetron (ZOFRAN) 4 MG tablet Take 1-2 tablets (4-8 mg total) by mouth every 8 (eight) hours as needed for nausea or vomiting. 06/22/19   Aundra Dubin, PA-C  oxyCODONE (OXY IR/ROXICODONE) 5 MG immediate release tablet Take 1 tablet (5 mg total) by mouth at bedtime as needed for severe pain. 07/02/19   Leandrew Koyanagi, MD  oxyCODONE (ROXICODONE) 5 MG immediate release tablet Take 1-2 tabs po tid  prn pain 06/22/19   Aundra Dubin, PA-C  pantoprazole (PROTONIX) 40 MG tablet Take 40 mg by mouth daily.    [provider]  rizatriptan (MAXALT) 10 MG tablet Take 10 mg by mouth as needed for migraine. May repeat in 2 hours if needed     [provider]  zinc sulfate 220 (50 Zn) MG capsule Take 1 capsule (220 mg total) by mouth daily. 06/22/19   Aundra Dubin, PA-C    Allergies    Depakote [divalproex sodium] and Topamax [topiramate]  Review of Systems   Review of Systems  All other systems reviewed and are negative.   Physical Exam Updated Vital Signs BP (!) 146/98 (BP Location: Right Arm)   Pulse 89   Temp 98.9 F (37.2 C) (Oral)   Resp 19   SpO2 100%   Physical Exam Vitals and nursing note reviewed.  Constitutional:      Appearance: Normal appearance. She is obese.  HENT:     Head: Normocephalic.  Cardiovascular:     Rate and Rhythm: Normal rate.  Pulmonary:     Effort: Pulmonary effort is normal.  Musculoskeletal:        General: Tenderness present.     Right knee: No swelling or deformity. Normal range of motion. Tenderness present over the MCL and PCL. No medial joint line, lateral joint line or patellar tendon tenderness. PCL laxity present.     Right lower leg: No swelling.     Left lower leg: No edema.     Right ankle:     Right Achilles Tendon: Normal.     Comments: Severe tenderness with the anterior drawer test  Skin:    General: Skin is warm and dry.  Neurological:     General: No focal deficit present.     Mental Status: She is alert and oriented to person, place, and time. Mental status is at baseline.  Psychiatric:        Mood and Affect: Mood normal.        Behavior: Behavior normal.        Thought Content: Thought content normal.     ED Results / Procedures / Treatments   Labs (all labs ordered are listed, but only abnormal results are displayed) Labs Reviewed - No data to display  EKG None  Radiology DG Knee  Complete 4 Views Right  Result Date: 12/05/2019 CLINICAL DATA:  Pain following fall EXAM: RIGHT KNEE - COMPLETE 4+ VIEW COMPARISON:  None FINDINGS: Frontal, lateral, and bilateral oblique views were obtained. No evident fracture or dislocation. No joint effusion. There is slight narrowing of the patellofemoral joint. No other joint space narrowing.  There is mild spurring medially. No erosive change. IMPRESSION: Mild narrowing patellofemoral joint. Other joint spaces appear unremarkable. There is mild spurring medially. No fracture, dislocation, or joint effusion. Electronically Signed   By: Lowella Grip III M.D.   On: 12/05/2019 11:48    Procedures Procedures (including critical care time)  Medications Ordered in ED Medications - No data to display  ED Course  I have reviewed the triage vital signs and the nursing notes.  Pertinent labs & imaging results that were available during my care of the patient were reviewed by me and considered in my medical decision making (see chart for details).    MDM Rules/Calculators/A&P                      Patient presenting with worsening right knee pain after a near fall where her knee was hyperflexed when she missed stepped.  She is having significant posterior knee pain and significant tenderness with anterior drawer testing.  No definitive laxity at this point.  There is no significant swelling.  X-ray is normal.  Concern for knee sprain versus ligamentous injury.  Patient already follows with an orthopedist and will have her follow-up next week if pain persists.  She has a walker at home she can use if it is too painful to bear weight.  Final Clinical Impression(s) / ED Diagnoses Final diagnoses:  Sprain of right knee, unspecified ligament, initial encounter    Rx / DC Orders ED Discharge Orders         Ordered    HYDROcodone-acetaminophen (NORCO) 5-325 MG tablet  Every 6 hours PRN     12/05/19 1207           Blanchie Dessert, MD  12/05/19 1214

## 2020-03-07 ENCOUNTER — Encounter: Payer: Self-pay | Admitting: Orthopaedic Surgery

## 2020-03-07 ENCOUNTER — Ambulatory Visit: Payer: Medicare Other | Admitting: Orthopaedic Surgery

## 2020-03-07 DIAGNOSIS — M25561 Pain in right knee: Secondary | ICD-10-CM | POA: Diagnosis not present

## 2020-03-07 DIAGNOSIS — G8929 Other chronic pain: Secondary | ICD-10-CM | POA: Diagnosis not present

## 2020-03-07 NOTE — Progress Notes (Signed)
Office Visit Note   Patient: Jaime Solomon           Date of Birth: [birth year]           MRN: [id number] Visit Date: 03/07/2020              Requested by: Shirline Frees, MD [place],  [place] [id number] PCP: Shirline Frees, MD   Assessment & Plan: Visit Diagnoses:  1. Chronic pain of right knee     Plan: Impression is acute right medial meniscal tear.  Given the history of injury as well as an effusion my suspicion is high for medial meniscal tear.  Therefore we will need to obtain MRI of the right knee.  Follow-up after the MRI.  Follow-Up Instructions: Return if symptoms worsen or fail to improve.   Orders:  No orders of the defined types were placed in this encounter.  No orders of the defined types were placed in this encounter.     Procedures: No procedures performed   Clinical Data: No additional findings.   Subjective: Chief Complaint  Patient presents with  . Right Knee - Pain    Jaime Solomon is a 59 year old who I treated last year for a humeral shaft fracture with radial nerve palsy that ultimately resolved.  She comes in for evaluation of a new right knee problem.  In early April she stepped down awkwardly and landed on a hyperextended knee.  She saw her PCP and went to the ER for this.  She feels like the pain has gotten worse and she has continued to have swelling.  She takes ibuprofen for the pain.  Initially got a little bit better but then got worse when she was getting up from the ground and twisted her knee again.  She is having a lot of pain with weightbearing.   Review of Systems  Constitutional: Negative.   HENT: Negative.   Eyes: Negative.   Respiratory: Negative.   Cardiovascular: Negative.   Endocrine: Negative.   Musculoskeletal: Negative.   Neurological: Negative.   Hematological: Negative.   Psychiatric/Behavioral: Negative.   All other systems reviewed and are negative.    Objective: Vital Signs: There were  no vitals taken for this visit.  Physical Exam Vitals and nursing note reviewed.  Constitutional:      Appearance: She is well-developed.  Pulmonary:     Effort: Pulmonary effort is normal.  Skin:    General: Skin is warm.     Capillary Refill: Capillary refill takes less than 2 seconds.  Neurological:     Mental Status: She is alert and oriented to person, place, and time.  Psychiatric:        Behavior: Behavior normal.        Thought Content: Thought content normal.        Judgment: Judgment normal.     Ortho Exam Right knee shows a joint effusion.  Significant medial joint line tenderness.  Range of motion is limited secondary to pain and the effusion.  Collaterals and cruciates are stable. Specialty Comments:  No specialty comments available.  Imaging: No results found.   PMFS History: Patient Active Problem List   Diagnosis Date Noted  . History of open reduction and internal fixation (ORIF) procedure 06/21/2019  . Closed displaced transverse fracture of shaft of left humerus 06/17/2019  . Breast cancer ([facility]) 05/22/2005   Past Medical History:  Diagnosis Date  . Anxiety   . Breast cancer ([facility]) 2006  .  Breast cancer ([facility]) 05/22/2012  . GERD (gastroesophageal reflux disease)   . Headache   . Personal history of radiation therapy     Family History  Problem Relation Age of Onset  . Diabetes Father   . Hypertension Father     Past Surgical History:  Procedure Laterality Date  . angiography  2010   head  . BREAST LUMPECTOMY Right 2006  . CHOLECYSTECTOMY    . MASTECTOMY PARTIAL / LUMPECTOMY  2006   right   . ORIF HUMERUS FRACTURE Left 06/21/2019   Procedure: OPEN REDUCTION INTERNAL FIXATION (ORIF) LEFT HUMERAL SHAFT FRACTURE;  Surgeon: Leandrew Koyanagi, MD;  Location: [facility];  Service: Orthopedics;  Laterality: Left;  . PARTIAL HYSTERECTOMY  1991  . ROTATOR CUFF REPAIR Right    Social History   Occupational History  . Not on file  Tobacco Use  . Smoking  status: Former Smoker    Quit date: 05/22/2005    Years since quitting: 14.8  . Smokeless tobacco: Never Used  Vaping Use  . Vaping Use: Never used  Substance and Sexual Activity  . Alcohol use: Yes    Alcohol/week: 2.0 standard drinks    Types: 2 Glasses of wine per week  . Drug use: No  . Sexual activity: Not Currently

## 2020-03-08 ENCOUNTER — Other Ambulatory Visit: Payer: Self-pay

## 2020-03-08 DIAGNOSIS — G8929 Other chronic pain: Secondary | ICD-10-CM

## 2020-03-08 DIAGNOSIS — M25561 Pain in right knee: Secondary | ICD-10-CM

## 2020-04-02 ENCOUNTER — Other Ambulatory Visit: Payer: Self-pay

## 2020-04-02 ENCOUNTER — Ambulatory Visit
Admission: RE | Admit: 2020-04-02 | Discharge: 2020-04-02 | Disposition: A | Discharge status: Home / Self Care | Payer: Medicare Other | Source: Ambulatory Visit | Attending: Orthopaedic Surgery | Admitting: Orthopaedic Surgery

## 2020-04-02 DIAGNOSIS — G8929 Other chronic pain: Secondary | ICD-10-CM

## 2020-04-03 NOTE — Progress Notes (Signed)
Needs f/u appt 

## 2020-04-12 ENCOUNTER — Encounter: Payer: Self-pay | Admitting: Orthopaedic Surgery

## 2020-04-12 ENCOUNTER — Ambulatory Visit: Payer: Medicare Other | Admitting: Orthopaedic Surgery

## 2020-04-12 DIAGNOSIS — S83231D Complex tear of medial meniscus, current injury, right knee, subsequent encounter: Secondary | ICD-10-CM | POA: Diagnosis not present

## 2020-04-12 MED ORDER — METHYLPREDNISOLONE ACETATE 40 MG/ML IJ SUSP
40.0000 mg | INTRAMUSCULAR | Status: AC | PRN
  Administered 2020-04-12: 40 mg via INTRA_ARTICULAR

## 2020-04-12 MED ORDER — BUPIVACAINE HCL 0.25 % IJ SOLN
2.0000 mL | INTRAMUSCULAR | Status: AC | PRN
  Administered 2020-04-12: 2 mL via INTRA_ARTICULAR

## 2020-04-12 MED ORDER — LIDOCAINE HCL 1 % IJ SOLN
2.0000 mL | INTRAMUSCULAR | Status: AC | PRN
  Administered 2020-04-12: 2 mL

## 2020-04-12 NOTE — Progress Notes (Signed)
Office Visit Note   Patient: Dawnisha Marquina           Date of Birth: [birth year]           MRN: [id number] Visit Date: 04/12/2020              Requested by: Shirline Frees, MD [address] [place],  [place] [address] PCP: Shirline Frees, MD   Assessment & Plan: Visit Diagnoses:  1. Complex tear of medial meniscus of right knee, unspecified whether old or current tear, subsequent encounter     Plan: MRI shows advanced patellofemoral chondromalacia.  She has meniscal root tear with extrusion and bony edema of the medial tibial plateau.  These findings were reviewed with the patient detail and discussed her treatment options that include cortisone injection and relative activity modification.  She understands that a total knee replacement is a reasonable likelihood in the future given the meniscal root tear.  Other options discussed were arthroscopic medial meniscal root repair but she understands that this would not improve the patellofemoral chondromalacia.  We will see how she response to the cortisone injection today.  11 cc of joint fluid aspirated from the knee today.  We also gave her a pamphlet on Visco injection.  Total face to face encounter time was greater than 25 minutes and over half of this time was spent in counseling and/or coordination of care.  Follow-Up Instructions: Return if symptoms worsen or fail to improve.   Orders:  Orders Placed This Encounter  Procedures  . Large Joint Inj: R knee   No orders of the defined types were placed in this encounter.     Procedures: Large Joint Inj: R knee on 04/12/2020 9:44 AM Indications: pain Details: 22 G needle, anterolateral approach Medications: 2 mL lidocaine 1 %; 2 mL bupivacaine 0.25 %; 40 mg methylPREDNISolone acetate 40 MG/ML      Clinical Data: No additional findings.   Subjective: Chief Complaint  Patient presents with  . Right Knee - Pain, Follow-up    HPI patient is a pleasant  59 year old female who comes in today to discuss MRI results of the right knee.  She sustained an injury to the right knee which occurred this past April.  She has been having pain with straightahead walking.  She denies any specific pain with pivoting.  No mechanical symptoms.  She was seen by [place] recently where it was thought that she had a medial meniscus tear.  Subsequent MRI of the right knee ordered which showed a large radial tear of the posterior horn medial meniscus with peripheral meniscal extrusion as well as a cystic mass superficial aspect posterior medial joint capsule.  Review of Systems as detailed in HPI.  All others reviewed and are negative.   Objective: Vital Signs: There were no vitals taken for this visit.  Physical Exam well-developed well-nourished female no acute distress.  Alert and oriented x3.  Ortho Exam right knee shows a small joint effusion.  Medial joint tenderness.  Specialty Comments:  No specialty comments available.  Imaging: No new imaging   PMFS History: Patient Active Problem List   Diagnosis Date Noted  . History of open reduction and internal fixation (ORIF) procedure 06/21/2019  . Closed displaced transverse fracture of shaft of left humerus 06/17/2019  . Breast cancer ([facility]) 05/22/2005   Past Medical History:  Diagnosis Date  . Anxiety   . Breast cancer ([facility]) 2006  . Breast cancer ([facility]) 05/22/2012  . GERD (  gastroesophageal reflux disease)   . Headache   . Personal history of radiation therapy     Family History  Problem Relation Age of Onset  . Diabetes Father   . Hypertension Father     Past Surgical History:  Procedure Laterality Date  . angiography  2010   head  . BREAST LUMPECTOMY Right 2006  . CHOLECYSTECTOMY    . MASTECTOMY PARTIAL / LUMPECTOMY  2006   right   . ORIF HUMERUS FRACTURE Left 06/21/2019   Procedure: OPEN REDUCTION INTERNAL FIXATION (ORIF) LEFT HUMERAL SHAFT FRACTURE;  Surgeon: Leandrew Koyanagi, MD;  Location: [facility];  Service: Orthopedics;  Laterality: Left;  . PARTIAL HYSTERECTOMY  1991  . ROTATOR CUFF REPAIR Right    Social History   Occupational History  . Not on file  Tobacco Use  . Smoking status: Former Smoker    Quit date: 05/22/2005    Years since quitting: 14.9  . Smokeless tobacco: Never Used  Vaping Use  . Vaping Use: Never used  Substance and Sexual Activity  . Alcohol use: Yes    Alcohol/week: 2.0 standard drinks    Types: 2 Glasses of wine per week  . Drug use: No  . Sexual activity: Not Currently

## 2020-04-26 DIAGNOSIS — Z853 Personal history of malignant neoplasm of breast: Secondary | ICD-10-CM | POA: Diagnosis not present

## 2020-04-26 DIAGNOSIS — R0602 Shortness of breath: Secondary | ICD-10-CM | POA: Diagnosis present

## 2020-04-26 DIAGNOSIS — R030 Elevated blood-pressure reading, without diagnosis of hypertension: Secondary | ICD-10-CM | POA: Diagnosis not present

## 2020-04-26 DIAGNOSIS — Z79899 Other long term (current) drug therapy: Secondary | ICD-10-CM | POA: Insufficient documentation

## 2020-04-26 DIAGNOSIS — J205 Acute bronchitis due to respiratory syncytial virus: Secondary | ICD-10-CM | POA: Insufficient documentation

## 2020-04-26 DIAGNOSIS — Z87891 Personal history of nicotine dependence: Secondary | ICD-10-CM | POA: Diagnosis not present

## 2020-04-26 NOTE — ED Triage Notes (Signed)
Pt states she was diagnosed with RSV on Friday. She was started on antibiotics yesterday. Pt says she is wheezing more than before. States feels heavy in her chest.

## 2020-04-27 ENCOUNTER — Emergency Department (HOSPITAL_COMMUNITY)
Admission: EM | Admit: 2020-04-27 | Discharge: 2020-04-27 | Disposition: A | Discharge status: Home / Self Care | Payer: Medicare Other | Attending: Emergency Medicine | Admitting: Emergency Medicine

## 2020-04-27 ENCOUNTER — Other Ambulatory Visit: Payer: Self-pay

## 2020-04-27 ENCOUNTER — Encounter (HOSPITAL_COMMUNITY): Payer: Self-pay | Admitting: *Deleted

## 2020-04-27 ENCOUNTER — Emergency Department (HOSPITAL_COMMUNITY): Payer: Medicare Other

## 2020-04-27 DIAGNOSIS — J205 Acute bronchitis due to respiratory syncytial virus: Secondary | ICD-10-CM

## 2020-04-27 DIAGNOSIS — R030 Elevated blood-pressure reading, without diagnosis of hypertension: Secondary | ICD-10-CM

## 2020-04-27 MED ORDER — ALBUTEROL SULFATE HFA 108 (90 BASE) MCG/ACT IN AERS: 2.0000 | INHALATION_SPRAY | RESPIRATORY_TRACT | 0 refills | Status: AC | PRN

## 2020-04-27 MED ORDER — PREDNISONE 50 MG PO TABS: 50.0000 mg | ORAL_TABLET | Freq: Every day | ORAL | 0 refills | Status: DC

## 2020-04-27 MED ORDER — PREDNISONE 20 MG PO TABS
60.0000 mg | ORAL_TABLET | Freq: Once | ORAL | Status: AC
  Administered 2020-04-27: 60 mg via ORAL
  Filled 2020-04-27: qty 3

## 2020-04-27 MED ORDER — IPRATROPIUM-ALBUTEROL 0.5-2.5 (3) MG/3ML IN SOLN
3.0000 mL | Freq: Once | RESPIRATORY_TRACT | Status: AC
  Administered 2020-04-27: 3 mL via RESPIRATORY_TRACT
  Filled 2020-04-27: qty 3

## 2020-04-27 NOTE — Discharge Instructions (Addendum)
Your blood pressure was elevated today.  Please have it rechecked several times in the next 1-2 weeks.  If it stays elevated, you may need to be on medication to control it.  Return if your symptoms are getting worse.

## 2020-04-27 NOTE — ED Provider Notes (Signed)
[facility] DEPT Provider Note   CSN: [id number] Arrival date & time: 04/26/20  2349   History Chief Complaint  Patient presents with  . Shortness of Breath    Jaime Solomon is a 59 y.o. female.  The history is provided by the patient.  Shortness of Breath She has history of breast cancer and comes in because of coughing and wheezing.  She started getting sick 6 days ago with sore throat and runny nose and cough.  Cough has been mainly nonproductive.  She called her primary care provider who gave her a prescription for azithromycin.  Sore throat and rhinorrhea have subsided but cough has persisted.  She has noted some intermittent dyspnea.  There has been no nausea or vomiting.  She denies fever chills.  She denies arthralgias or myalgias.  She was seen at her doctor's office 2 days ago and had a nasal swab which was negative for Covid and flu but positive for RSV.  Today, she has noted some wheezing and that is what concerned her enough to come to the hospital.  She has been vaccinated against COVID-19.  Past Medical History:  Diagnosis Date  . Anxiety   . Breast cancer ([place]) 2006  . Breast cancer ([place]) 05/22/2012  . GERD (gastroesophageal reflux disease)   . Headache   . Personal history of radiation therapy     Patient Active Problem List   Diagnosis Date Noted  . History of open reduction and internal fixation (ORIF) procedure 06/21/2019  . Closed displaced transverse fracture of shaft of left humerus 06/17/2019  . Breast cancer ([place]) 05/22/2005    Past Surgical History:  Procedure Laterality Date  . angiography  2010   head  . BREAST LUMPECTOMY Right 2006  . CHOLECYSTECTOMY    . MASTECTOMY PARTIAL / LUMPECTOMY  2006   right   . ORIF HUMERUS FRACTURE Left 06/21/2019   Procedure: OPEN REDUCTION INTERNAL FIXATION (ORIF) LEFT HUMERAL SHAFT FRACTURE;  Surgeon: Leandrew Koyanagi, MD;  Location: [facility];  Service: Orthopedics;  Laterality: Left;  .  PARTIAL HYSTERECTOMY  1991  . ROTATOR CUFF REPAIR Right      OB History   No obstetric history on file.     Family History  Problem Relation Age of Onset  . Diabetes Father   . Hypertension Father     Social History   Tobacco Use  . Smoking status: Former Smoker    Quit date: 05/22/2005    Years since quitting: 14.9  . Smokeless tobacco: Never Used  Vaping Use  . Vaping Use: Never used  Substance Use Topics  . Alcohol use: Yes    Alcohol/week: 2.0 standard drinks    Types: 2 Glasses of wine per week  . Drug use: No    Home Medications Prior to Admission medications   Medication Sig Start Date End Date Taking? Authorizing Provider  ALPRAZolam Duanne Moron) 0.5 MG tablet Take 0.5 mg by mouth at bedtime as needed for sleep.     [provider]  buPROPion (WELLBUTRIN XL) 300 MG 24 hr tablet Take 300 mg by mouth daily.    [provider]  calcium-vitamin D (OSCAL WITH D) 500-200 MG-UNIT tablet Take 1 tablet by mouth 3 (three) times daily. 06/22/19   Aundra Dubin, PA-C  citalopram (CELEXA) 10 MG tablet Take 10 mg by mouth daily.    [provider]  diphenhydrAMINE (SOMINEX) 25 MG tablet Take 50 mg by mouth at bedtime as needed  for sleep.     [provider]  gabapentin (NEURONTIN) 300 MG capsule Take 2 capsules by mouth at bedtime.     [provider]  HYDROcodone-acetaminophen (NORCO) 5-325 MG tablet Take 1 tablet by mouth every 6 (six) hours as needed for severe pain. 12/05/19   Blanchie Dessert, MD  ketorolac (TORADOL) 10 MG tablet Take 1 tablet (10 mg total) by mouth 2 (two) times daily as needed. 06/21/19   Leandrew Koyanagi, MD  nortriptyline (PAMELOR) 50 MG capsule Take 150 mg by mouth at bedtime.    [provider]  ondansetron (ZOFRAN) 4 MG tablet Take 1-2 tablets (4-8 mg total) by mouth every 8 (eight) hours as needed for nausea or vomiting. 06/22/19   Aundra Dubin, PA-C  oxyCODONE (OXY IR/ROXICODONE) 5 MG immediate  release tablet Take 1 tablet (5 mg total) by mouth at bedtime as needed for severe pain. 07/02/19   Leandrew Koyanagi, MD  oxyCODONE (ROXICODONE) 5 MG immediate release tablet Take 1-2 tabs po tid prn pain 06/22/19   Aundra Dubin, PA-C  pantoprazole (PROTONIX) 40 MG tablet Take 40 mg by mouth daily.    [provider]  rizatriptan (MAXALT) 10 MG tablet Take 10 mg by mouth as needed for migraine. May repeat in 2 hours if needed     [provider]  zinc sulfate 220 (50 Zn) MG capsule Take 1 capsule (220 mg total) by mouth daily. 06/22/19   Aundra Dubin, PA-C    Allergies    Depakote [divalproex sodium] and Topamax [topiramate]  Review of Systems   Review of Systems  Respiratory: Positive for shortness of breath.   All other systems reviewed and are negative.   Physical Exam Updated Vital Signs BP (!) 161/97   Pulse 83   Temp 98.4 F (36.9 C) (Oral)   Resp 19   Ht 5\' 4"  (1.626 m)   Wt 103 kg   SpO2 98%   BMI 38.96 kg/m   Physical Exam Vitals and nursing note reviewed.   59 year old female, resting comfortably and in no acute distress. Vital signs are significant for elevated blood pressure. Oxygen saturation is 98%, which is normal. Head is normocephalic and atraumatic. PERRLA, EOMI. Oropharynx is clear. Neck is nontender and supple without adenopathy or JVD. Back is nontender and there is no CVA tenderness. Lungs are clear without rales, wheezes, or rhonchi.  There is a slightly prolonged exhalation phase. Chest is nontender. Heart has regular rate and rhythm without murmur. Abdomen is soft, flat, nontender without masses or hepatosplenomegaly and peristalsis is normoactive. Extremities have no cyanosis or edema, full range of motion is present. Skin is warm and dry without rash. Neurologic: Mental status is normal, cranial nerves are intact, there are no motor or sensory deficits.   ED Results / Procedures / Treatments   Labs (all labs ordered are  listed, but only abnormal results are displayed) Labs Reviewed - No data to display   EKG EKG Interpretation  Date/Time:  Thursday April 27 2020 00:02:10 EDT Ventricular Rate:  94 PR Interval:    QRS Duration: 102 QT Interval:  362 QTC Calculation: 453 R Axis:   -6 Text Interpretation: Sinus rhythm Probable left atrial enlargement Low voltage, precordial leads Baseline wander in lead(s) I II aVR aVF V3 V4 No old tracing to compare Confirmed by Delora Fuel ([id number]) on 04/27/2020 12:11:03 AM   Radiology DG Chest Portable 1 View  Result Date: 04/27/2020 CLINICAL DATA:  Dyspnea EXAM: PORTABLE CHEST 1 VIEW COMPARISON:  None. FINDINGS: Lung volumes are small, but are symmetric. Mild left basilar atelectasis or infiltrate. No pneumothorax or pleural effusion. Cardiac size within normal limits. Pulmonary vascularity is normal. No acute bone abnormality. IMPRESSION: Pulmonary hypoinflation. Mild left basilar atelectasis or infiltrate. Electronically Signed   By: Fidela Salisbury MD   On: 04/27/2020 00:21    Procedures Procedures   Medications Ordered in ED Medications  predniSONE (DELTASONE) tablet 60 mg (has no administration in time range)  ipratropium-albuterol (DUONEB) 0.5-2.5 (3) MG/3ML nebulizer solution 3 mL (3 mLs Nebulization Given 04/27/20 0323)    ED Course  I have reviewed the triage vital signs and the nursing notes.  Pertinent labs & imaging results that were available during my care of the patient were reviewed by me and considered in my medical decision making (see chart for details).  MDM Rules/Calculators/A&P Viral respiratory tract infection with bronchospasm.  Patient has produced her outpatient lab tests confirming positive for RSV and negative for flu and Covid.  Old records are reviewed, and she has no relevant past visits.  Chest x-ray shows no evidence of pneumonia.  ECG is unremarkable.  She will be given nebulizer treatment with albuterol and ipratropium and also  dose of prednisone.  She feels significantly better after above-noted treatment.  On reexam, lungs are clear.  She is discharged with prescription for prednisone and albuterol inhaler.  Return precautions discussed.  Final Clinical Impression(s) / ED Diagnoses Final diagnoses:  RSV bronchitis  Elevated blood pressure reading without diagnosis of hypertension    Rx / DC Orders ED Discharge Orders         Ordered    predniSONE (DELTASONE) 50 MG tablet  Daily        04/27/20 0425    albuterol (VENTOLIN HFA) 108 (90 Base) MCG/ACT inhaler  Every 4 hours PRN        04/27/20 9574           Delora Fuel, MD 73/40/37 [phone]

## 2020-04-27 NOTE — ED Notes (Addendum)
Patient swabbed yesterday at [facility] walk-in clinic for Flu, RSV, and COVID. Patient states she was negative for flu and COVID, positive for RSV. Patient is attempting to get a copy of those results.

## 2020-05-23 ENCOUNTER — Ambulatory Visit (INDEPENDENT_AMBULATORY_CARE_PROVIDER_SITE_OTHER): Payer: Medicare Other | Admitting: Orthopaedic Surgery

## 2020-05-23 ENCOUNTER — Telehealth: Payer: Self-pay | Admitting: Orthopaedic Surgery

## 2020-05-23 ENCOUNTER — Encounter: Payer: Self-pay | Admitting: Orthopaedic Surgery

## 2020-05-23 ENCOUNTER — Other Ambulatory Visit: Payer: Self-pay | Admitting: Family Medicine

## 2020-05-23 ENCOUNTER — Ambulatory Visit: Payer: Self-pay

## 2020-05-23 ENCOUNTER — Other Ambulatory Visit: Payer: Self-pay | Admitting: Physician Assistant

## 2020-05-23 VITALS — Ht 64.5 in | Wt 229.8 lb

## 2020-05-23 DIAGNOSIS — Z1231 Encounter for screening mammogram for malignant neoplasm of breast: Secondary | ICD-10-CM

## 2020-05-23 DIAGNOSIS — M1711 Unilateral primary osteoarthritis, right knee: Secondary | ICD-10-CM | POA: Diagnosis not present

## 2020-05-23 MED ORDER — ACETAMINOPHEN-CODEINE #3 300-30 MG PO TABS: 1.0000 | ORAL_TABLET | Freq: Three times a day (TID) | ORAL | 0 refills | Status: DC | PRN

## 2020-05-23 NOTE — Telephone Encounter (Signed)
Duplicate message. 

## 2020-05-23 NOTE — Telephone Encounter (Signed)
Patient called. She would like Tylenol #3 sent to [facility] [place].

## 2020-05-23 NOTE — Telephone Encounter (Signed)
I sent in this am.  Will you check to see why they do not have it?

## 2020-05-23 NOTE — Progress Notes (Signed)
Office Visit Note   Patient: Jaime Solomon           Date of Birth: 1961/03/13           MRN: [id number] Visit Date: 05/23/2020              Requested by: Shirline Frees, MD [address],  [place] [address] PCP: Shirline Frees, MD   Assessment & Plan: Visit Diagnoses:  1. Primary localized osteoarthritis of right knee     Plan: Impression is advanced degenerative joint disease right knee.  Patient has tried oral anti-inflammatories and cortisone injections with only temporary relief of symptoms.  We discussed total knee arthroplasty at this point she would like to proceed.  Risk, benefits and possible occasions reviewed.  Rehab recovery time discussed.  All questions answered.  Prealbumin level drawn today.  Denies history of DVT or nickel allergy. Total face to face encounter time was greater than 25 minutes and over half of this time was spent in counseling and/or coordination of care.  Follow-Up Instructions: Return for post-op.   Orders:  Orders Placed This Encounter  Procedures  . XR KNEE 3 VIEW RIGHT   No orders of the defined types were placed in this encounter.     Procedures: No procedures performed   Clinical Data: No additional findings.   Subjective: Chief Complaint  Patient presents with  . Right Knee - Pain    HPI patient is a pleasant 59 year old female who comes in today with recurrent right knee pain.  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  She recently underwent right knee cortisone injection on 04/12/2020 which only provided about 1 week relief of symptoms.  She has had increasing pain primarily to the medial aspect.  Is starting to affect her day-to-day activities.  Review of Systems as detailed in HPI.  All others reviewed and are negative.   Objective: Vital Signs: Ht 5' 4.5" (1.638 m)   Wt 229 lb 12.8 oz (104.2 kg)   BMI 38.84 kg/m    Physical Exam well-developed well-nourished female no acute distress.  Alert and oriented x3.  Ortho Exam stable exam of the right knee  Specialty Comments:  No specialty comments available.  Imaging: XR KNEE 3 VIEW RIGHT  Result Date: 05/23/2020 X-rays reveal advanced degenerative changes to the medial and patellofemoral compartments    PMFS History: Patient Active Problem List   Diagnosis Date Noted  . History of open reduction and internal fixation (ORIF) procedure 06/21/2019  . Closed displaced transverse fracture of shaft of left humerus 06/17/2019  . Breast cancer ([place]) 05/22/2005   Past Medical History:  Diagnosis Date  . Anxiety   . Breast cancer ([place]) 2006  . Breast cancer ([place]) 05/22/2012  . GERD (gastroesophageal reflux disease)   . Headache   . Personal history of radiation therapy     Family History  Problem Relation Age of Onset  . Diabetes Father   . Hypertension Father     Past Surgical History:  Procedure Laterality Date  . angiography  2010   head  . BREAST LUMPECTOMY Right 2006  . CHOLECYSTECTOMY    . MASTECTOMY PARTIAL / LUMPECTOMY  2006   right   . ORIF HUMERUS FRACTURE Left 06/21/2019   Procedure: OPEN REDUCTION INTERNAL FIXATION (ORIF) LEFT HUMERAL SHAFT FRACTURE;  Surgeon: Leandrew Koyanagi, MD;  Location: [facility];  Service: Orthopedics;  Laterality: Left;  . PARTIAL HYSTERECTOMY  1991  . ROTATOR CUFF REPAIR Right    Social History   Occupational History  . Not on file  Tobacco Use  . Smoking status: Former Smoker    Quit date: 05/22/2005    Years since quitting: 15.0  . Smokeless tobacco: Never Used  Vaping Use  . Vaping Use: Never used  Substance and Sexual Activity  . Alcohol use: Yes    Alcohol/week: 2.0 standard drinks    Types: 2 Glasses of wine per week  . Drug use: No  . Sexual activity: Not Currently

## 2020-05-23 NOTE — Telephone Encounter (Signed)
Sent in to that location this am.  Do they not have it?

## 2020-05-24 ENCOUNTER — Telehealth: Payer: Self-pay

## 2020-05-24 LAB — EXTRA LAV TOP TUBE

## 2020-05-24 LAB — PREALBUMIN: Prealbumin: 30 mg/dL (ref 17–34)

## 2020-05-24 NOTE — Telephone Encounter (Signed)
It might be easier just to keep her in the hospital for an additional couple of days instead of going to rehab.

## 2020-05-24 NOTE — Telephone Encounter (Signed)
Patient is requesting to go to rehab for 2 days after her surgery date she cant find anyone to take care of her. Call back:[phone]

## 2020-05-30 ENCOUNTER — Other Ambulatory Visit: Payer: Self-pay

## 2020-06-14 ENCOUNTER — Other Ambulatory Visit: Payer: Self-pay | Admitting: Physician Assistant

## 2020-06-15 ENCOUNTER — Ambulatory Visit: Payer: Medicare Other

## 2020-06-16 ENCOUNTER — Telehealth: Payer: Self-pay | Admitting: Orthopaedic Surgery

## 2020-06-16 NOTE — Telephone Encounter (Signed)
Please advise. OK for refill? 

## 2020-06-16 NOTE — Telephone Encounter (Signed)
Patient called requesting a refill of tylenol 3. Please send to pharmacy on file. Patient phone number is [phone].

## 2020-06-17 NOTE — Telephone Encounter (Signed)
Ok to do

## 2020-06-19 ENCOUNTER — Encounter: Payer: Self-pay | Admitting: Orthopaedic Surgery

## 2020-06-19 ENCOUNTER — Telehealth: Payer: Self-pay | Admitting: Orthopaedic Surgery

## 2020-06-19 MED ORDER — ACETAMINOPHEN-CODEINE #3 300-30 MG PO TABS
1.0000 | ORAL_TABLET | Freq: Three times a day (TID) | ORAL | 0 refills | Status: DC | PRN
Start: 2020-06-19 — End: 2020-07-11

## 2020-06-19 NOTE — Telephone Encounter (Signed)
Called to pharmacy 

## 2020-06-19 NOTE — Telephone Encounter (Signed)
Patient called requesting a call back from [place]. Patient states to ask Dr. Erlinda Hong if it is ok for her to have Covid booster before her surgery on  11/8. Patient states she has has both covid vaccinations. Please call patient about this matter at [phone].

## 2020-06-19 NOTE — Telephone Encounter (Signed)
Duplicate message. See mychart message.

## 2020-07-05 ENCOUNTER — Other Ambulatory Visit: Payer: Self-pay | Admitting: Physician Assistant

## 2020-07-05 MED ORDER — ASPIRIN EC 81 MG PO TBEC: 81.0000 mg | DELAYED_RELEASE_TABLET | Freq: Two times a day (BID) | ORAL | 0 refills | Status: AC

## 2020-07-05 MED ORDER — ONDANSETRON HCL 4 MG PO TABS: 4.0000 mg | ORAL_TABLET | Freq: Three times a day (TID) | ORAL | 0 refills | Status: AC | PRN

## 2020-07-05 MED ORDER — OXYCODONE-ACETAMINOPHEN 5-325 MG PO TABS: 1.0000 | ORAL_TABLET | Freq: Four times a day (QID) | ORAL | 0 refills | Status: AC | PRN

## 2020-07-05 MED ORDER — DOCUSATE SODIUM 100 MG PO CAPS: 100.0000 mg | ORAL_CAPSULE | Freq: Every day | ORAL | 2 refills | Status: AC | PRN

## 2020-07-05 MED ORDER — METHOCARBAMOL 500 MG PO TABS: 500.0000 mg | ORAL_TABLET | Freq: Two times a day (BID) | ORAL | 0 refills | Status: AC | PRN

## 2020-07-05 NOTE — Progress Notes (Signed)
[id number] [place], [place] - [address] [place] [address] Phone: [phone] Fax: [phone]      Your procedure is scheduled on Monday, July 10, 2020.  Report to [facility] Main Entrance "A" at 0530 A.M., and check in at the Admitting office.  Call this number if you have questions or problems between now and the morning of surgery:  [phone]   Remember:  Do not eat after midnight the night before your surgery  You may drink clear liquids until 04:30am the morning of your surgery.   Clear liquids allowed are: Water, Non-Citrus Juices (without pulp), Carbonated Beverages, Clear Tea, Black Coffee Only, and Gatorade   Enhanced Recovery after Surgery for Orthopedics Enhanced Recovery after Surgery is a protocol used to improve the stress on your body and your recovery after surgery.  Patient Instructions  . The night before surgery:  o No food after midnight. ONLY clear liquids after midnight  .  Marland Kitchen The day of surgery (if you do NOT have diabetes):  o Drink ONE (1) Pre-Surgery Clear Ensure by 04:30 am the morning of surgery   o This drink was given to you during your hospital  pre-op appointment visit. o Nothing else to drink after completing the  Pre-Surgery Clear Ensure.         If you have questions, please contact your surgeon's office.     Take these medicines the morning of surgery with A SIP OF WATER: Bupropion (Wellbutrin XL) Citalopram (Celexa) Gabapentin (Neurontin) Pantoprazole (Protonix)   If needed you may take the following medications: Brimonidine Tartrate (Lumify) eye drops  As of today, STOP taking any Aspirin-containing products, Aleve, Naproxen, Ibuprofen, Motrin, Advil, Goody's, BC's, all herbal medications, fish oil, and all vitamins.  Follow your surgeon's instructions on when to stop Aspirin.  If no instructions were given by your surgeon then you will need to  call the office to get those instructions.                         Do not wear jewelry, make up, or nail polish            Do not wear lotions, powders, perfumes, or deodorant.            Do not shave 48 hours prior to surgery.  Men may shave face and neck.            Do not bring valuables to the hospital.            [facility] is not responsible for any belongings or valuables.  Do NOT Smoke (Tobacco/Vaping) or drink Alcohol 24 hours prior to your procedure  If you use a CPAP at night, you may bring all equipment for your overnight stay.   Contacts, glasses, dentures or bridgework may not be worn into surgery.      For patients admitted to the hospital, discharge time will be determined by your treatment team.   Patients discharged the day of surgery will not be allowed to drive home, and someone needs to stay with them for 24 hours.    Special instructions:   - Preparing For Surgery  Before surgery, you can play an important role. Because skin is not sterile, your skin needs to be as free of germs as possible. You can reduce the number of germs on your skin by washing  with CHG (chlorahexidine gluconate) Soap before surgery.  CHG is an antiseptic cleaner which kills germs and bonds with the skin to continue killing germs even after washing.    Oral Hygiene is also important to reduce your risk of infection.  Remember - BRUSH YOUR TEETH THE MORNING OF SURGERY WITH YOUR REGULAR TOOTHPASTE  Please do not use if you have an allergy to CHG or antibacterial soaps. If your skin becomes reddened/irritated stop using the CHG.  Do not shave (including legs and underarms) for at least 48 hours prior to first CHG shower. It is OK to shave your face.  Please follow these instructions carefully.   1. Shower the NIGHT BEFORE SURGERY and the MORNING OF SURGERY with CHG Soap.   2. If you chose to wash your hair, wash your hair first as usual with your normal shampoo.  3. After you  shampoo, rinse your hair and body thoroughly to remove the shampoo.  4. Use CHG as you would any other liquid soap. You can apply CHG directly to the skin and wash gently with a scrungie or a clean washcloth.   5. Apply the CHG Soap to your body ONLY FROM THE NECK DOWN.  Do not use on open wounds or open sores. Avoid contact with your eyes, ears, mouth and genitals (private parts). Wash Face and genitals (private parts)  with your normal soap.   6. Wash thoroughly, paying special attention to the area where your surgery will be performed.  7. Thoroughly rinse your body with warm water from the neck down.  8. DO NOT shower/wash with your normal soap after using and rinsing off the CHG Soap.  9. Pat yourself dry with a CLEAN TOWEL.  10. Wear CLEAN PAJAMAS to bed the night before surgery  11. Place CLEAN SHEETS on your bed the night of your first shower and DO NOT SLEEP WITH PETS.   Day of Surgery: Shower with CHG soap as directed Wear Clean/Comfortable clothing the morning of surgery Do not apply any deodorants/lotions.   Remember to brush your teeth WITH YOUR REGULAR TOOTHPASTE.   Please read over the following fact sheets that you were given.

## 2020-07-06 ENCOUNTER — Encounter: Payer: Self-pay | Admitting: Orthopaedic Surgery

## 2020-07-06 ENCOUNTER — Other Ambulatory Visit (HOSPITAL_COMMUNITY)
Admission: RE | Admit: 2020-07-06 | Discharge: 2020-07-06 | Disposition: A | Discharge status: Home / Self Care | Payer: Medicare Other | Source: Ambulatory Visit | Attending: Orthopaedic Surgery | Admitting: Orthopaedic Surgery

## 2020-07-06 ENCOUNTER — Other Ambulatory Visit: Payer: Self-pay

## 2020-07-06 ENCOUNTER — Encounter (HOSPITAL_COMMUNITY): Payer: Self-pay

## 2020-07-06 ENCOUNTER — Encounter (HOSPITAL_COMMUNITY)
Admission: RE | Admit: 2020-07-06 | Discharge: 2020-07-06 | Disposition: A | Discharge status: Home / Self Care | Payer: Medicare Other | Source: Ambulatory Visit | Attending: Physician Assistant | Admitting: Physician Assistant

## 2020-07-06 ENCOUNTER — Encounter (HOSPITAL_COMMUNITY)
Admission: RE | Admit: 2020-07-06 | Discharge: 2020-07-06 | Disposition: A | Discharge status: Home / Self Care | Payer: Medicare Other | Source: Ambulatory Visit | Attending: Orthopaedic Surgery | Admitting: Orthopaedic Surgery

## 2020-07-06 DIAGNOSIS — Z8679 Personal history of other diseases of the circulatory system: Secondary | ICD-10-CM | POA: Diagnosis not present

## 2020-07-06 DIAGNOSIS — Z87891 Personal history of nicotine dependence: Secondary | ICD-10-CM | POA: Insufficient documentation

## 2020-07-06 DIAGNOSIS — Z79899 Other long term (current) drug therapy: Secondary | ICD-10-CM | POA: Insufficient documentation

## 2020-07-06 DIAGNOSIS — Z20822 Contact with and (suspected) exposure to covid-19: Secondary | ICD-10-CM | POA: Diagnosis not present

## 2020-07-06 DIAGNOSIS — Z981 Arthrodesis status: Secondary | ICD-10-CM | POA: Diagnosis not present

## 2020-07-06 DIAGNOSIS — Z7982 Long term (current) use of aspirin: Secondary | ICD-10-CM | POA: Diagnosis not present

## 2020-07-06 DIAGNOSIS — Z01818 Encounter for other preprocedural examination: Secondary | ICD-10-CM | POA: Insufficient documentation

## 2020-07-06 DIAGNOSIS — Z853 Personal history of malignant neoplasm of breast: Secondary | ICD-10-CM | POA: Diagnosis not present

## 2020-07-06 DIAGNOSIS — M1711 Unilateral primary osteoarthritis, right knee: Secondary | ICD-10-CM | POA: Diagnosis not present

## 2020-07-06 LAB — CBC WITH DIFFERENTIAL/PLATELET
Abs Immature Granulocytes: 0.02 10*3/uL (ref 0.00–0.07)
Basophils Absolute: 0 10*3/uL (ref 0.0–0.1)
Basophils Relative: 1 %
Eosinophils Absolute: 0.2 10*3/uL (ref 0.0–0.5)
Eosinophils Relative: 3 %
HCT: 43 % (ref 36.0–46.0)
Hemoglobin: 13.9 g/dL (ref 12.0–15.0)
Immature Granulocytes: 0 %
Lymphocytes Relative: 45 %
Lymphs Abs: 3.1 10*3/uL (ref 0.7–4.0)
MCH: 30.8 pg (ref 26.0–34.0)
MCHC: 32.3 g/dL (ref 30.0–36.0)
MCV: 95.1 fL (ref 80.0–100.0)
Monocytes Absolute: 0.7 10*3/uL (ref 0.1–1.0)
Monocytes Relative: 10 %
Neutro Abs: 2.9 10*3/uL (ref 1.7–7.7)
Neutrophils Relative %: 41 %
Platelets: 263 10*3/uL (ref 150–400)
RBC: 4.52 MIL/uL (ref 3.87–5.11)
RDW: 13.4 % (ref 11.5–15.5)
WBC: 7 10*3/uL (ref 4.0–10.5)
nRBC: 0 % (ref 0.0–0.2)

## 2020-07-06 LAB — COMPREHENSIVE METABOLIC PANEL
ALT: 46 U/L — ABNORMAL HIGH (ref 0–44)
AST: 51 U/L — ABNORMAL HIGH (ref 15–41)
Albumin: 4.2 g/dL (ref 3.5–5.0)
Alkaline Phosphatase: 94 U/L (ref 38–126)
Anion gap: 13 (ref 5–15)
BUN: 11 mg/dL (ref 8–23)
CO2: 24 mmol/L (ref 22–32)
Calcium: 9.7 mg/dL (ref 8.9–10.3)
Chloride: 100 mmol/L (ref 98–111)
Creatinine, Ser: 1.03 mg/dL — ABNORMAL HIGH (ref 0.44–1.00)
GFR, Estimated: >60 mL/min (ref >60)
Glucose, Bld: 115 mg/dL — ABNORMAL HIGH (ref 70–99)
Potassium: 3.9 mmol/L (ref 3.5–5.1)
Sodium: 137 mmol/L (ref 135–145)
Total Bilirubin: 0.6 mg/dL (ref 0.3–1.2)
Total Protein: 7.4 g/dL (ref 6.5–8.1)

## 2020-07-06 LAB — PROTIME-INR
INR: 1 (ref 0.8–1.2)
Prothrombin Time: 12.5 seconds (ref 11.4–15.2)

## 2020-07-06 LAB — URINALYSIS, ROUTINE W REFLEX MICROSCOPIC
Bilirubin Urine: NEGATIVE
Glucose, UA: NEGATIVE mg/dL
Hgb urine dipstick: NEGATIVE
Ketones, ur: 5 mg/dL — AB
Leukocytes,Ua: NEGATIVE
Nitrite: NEGATIVE
Protein, ur: NEGATIVE mg/dL
Specific Gravity, Urine: 1.016 (ref 1.005–1.030)
pH: 6 (ref 5.0–8.0)

## 2020-07-06 LAB — SURGICAL PCR SCREEN
MRSA, PCR: NEGATIVE
Staphylococcus aureus: NEGATIVE

## 2020-07-06 LAB — TYPE AND SCREEN
ABO/RH(D): AB POS
Antibody Screen: NEGATIVE

## 2020-07-06 LAB — APTT: aPTT: 27 seconds (ref 24–36)

## 2020-07-06 LAB — SARS CORONAVIRUS 2 (TAT 6-24 HRS): SARS Coronavirus 2: NEGATIVE

## 2020-07-06 NOTE — Progress Notes (Signed)
PCP - Dr. Samara Snide Cardiologist - patient denies  PPM/ICD - n/a Device Orders -  Rep Notified -   Chest x-ray - 07/06/2020 EKG - 07/06/2020 Stress Test - patient denies ECHO - patient denies Cardiac Cath - patient denies  Sleep Study - patient denies CPAP - n/a  asting Blood Sugar - n/a Checks Blood Sugar _____ times a day  Blood Thinner Instructions: n/a Aspirin Instructions: patient is to start ASA after surgery  ERAS Protcol - clears until 0430 PRE-SURGERY Ensure or G2- Ensure provided per order and to be completed by 0430 the morning of surgery  COVID TEST- 07/06/2020 after PAT appointment   Anesthesia review: no  Patient denies shortness of breath, fever, cough and chest pain at PAT appointment   All instructions explained to the patient, with a verbal understanding of the material. Patient agrees to go over the instructions while at home for a better understanding. Patient also instructed to self quarantine after being tested for COVID-19. The opportunity to ask questions was provided.

## 2020-07-07 MED ORDER — TRANEXAMIC ACID 1000 MG/10ML IV SOLN
2000.0000 mg | INTRAVENOUS | Status: AC
  Administered 2020-07-10: 2000 mg via TOPICAL
  Filled 2020-07-07: qty 20

## 2020-07-07 NOTE — Progress Notes (Signed)
Anesthesia Chart Review:  Case: [id number] Date/Time: 07/10/20 0700   Procedure: RIGHT TOTAL KNEE ARTHROPLASTY (Right Knee)   Anesthesia type: Spinal   Pre-op diagnosis: right knee degenerative joint disease   Location: [facility] OR ROOM 04 / [facility]   Surgeons: Leandrew Koyanagi, MD      DISCUSSION: Patient is a 59 year old female scheduled for the above procedure.  History includes former smoker (quit 05/22/05), breast cancer (s/p right breast lumpectomy, radiation 2006), cerebral aneurysm (s/p ACOM aneurysm coling 12/2008), back surgery (L3-S1 laminectomy/fusion 09/22/18), fall with left humeral shaft fracture (s/p ORIF 06/21/19), RSV bronchitis 04/27/20. BMI is consistent with obesity.  Preoperative EKG appears stable.  Jaime Solomon denies shortness of breath, cough, fever, chest pain at PAT RN visit.  Preprocedure COVID-19 test negative on 07/06/2020.  Anesthesia team to evaluate on the day of surgery.   VS: BP 139/84   Pulse 97   Temp 36.7 C (Oral)   Resp 18   Ht 5\' 4"  (1.626 m)   Wt 104.2 kg   SpO2 100%   BMI 39.43 kg/m    PROVIDERS: Shirline Frees, MD is PCP    LABS: Labs reviewed: Acceptable for surgery. LFTs minimally elevated with AST 51, ALT 46. PLT count, PT/PTT WNL.  (all labs ordered are listed, but only abnormal results are displayed)  Labs Reviewed  COMPREHENSIVE METABOLIC PANEL - Abnormal; Notable for the following components:      Result Value   Glucose, Bld 115 (*)    Creatinine, Ser 1.03 (*)    AST 51 (*)    ALT 46 (*)    All other components within normal limits  URINALYSIS, ROUTINE W REFLEX MICROSCOPIC - Abnormal; Notable for the following components:   Color, Urine AMBER (*)    Ketones, ur 5 (*)    All other components within normal limits  SURGICAL PCR SCREEN  APTT  CBC WITH DIFFERENTIAL/PLATELET  PROTIME-INR  TYPE AND SCREEN     IMAGES: CXR 07/06/20: In process.    EKG: 07/06/20: Normal sinus rhythm Inferior infarct , age undetermined Cannot rule out Anterior  infarct , age undetermined Abnormal ECG No significant change since last tracing Confirmed by Vernell Leep ([id number]) on 07/06/2020 1:19:53 PM - Comparison preoperative EKG 09/14/18 from [facility] showed NSR, incomplete RBBB, inferior infarct (age undetermined), cannot rule out anterior infarct (age undetermined) by Result Narrative in [place].   CV: N/A   Past Medical History:  Diagnosis Date  . Anxiety   . Breast cancer ([place]) 2006  . Breast cancer ([place]) 05/22/2012  . Cerebral aneurysm without rupture 2010  . GERD (gastroesophageal reflux disease)   . Headache   . Personal history of radiation therapy     Past Surgical History:  Procedure Laterality Date  . ANEURYSM COILING  2010   coiling and stenting for cerebral aneurysm  . angiography  2010   head  . BACK SURGERY  2020   L3-S1 Laminectomy and fusion - plate and 8 screws  . BREAST LUMPECTOMY Right 2006  . CHOLECYSTECTOMY    . MASTECTOMY PARTIAL / LUMPECTOMY  2006   right   . ORIF HUMERUS FRACTURE Left 06/21/2019   Procedure: OPEN REDUCTION INTERNAL FIXATION (ORIF) LEFT HUMERAL SHAFT FRACTURE;  Surgeon: Leandrew Koyanagi, MD;  Location: [place];  Service: Orthopedics;  Laterality: Left;  . PARTIAL HYSTERECTOMY  1991  . ROTATOR CUFF REPAIR Right     MEDICATIONS: . acetaminophen-codeine (TYLENOL #3) 300-30 MG tablet  .  albuterol (VENTOLIN HFA) 108 (90 Base) MCG/ACT inhaler  . ALPRAZolam (XANAX) 0.5 MG tablet  . aspirin EC 81 MG tablet  . Brimonidine Tartrate (LUMIFY) 0.025 % SOLN  . buPROPion (WELLBUTRIN XL) 300 MG 24 hr tablet  . calcium-vitamin D (OSCAL WITH D) 500-200 MG-UNIT tablet  . citalopram (CELEXA) 40 MG tablet  . diphenhydrAMINE (SOMINEX) 25 MG tablet  . docusate sodium (COLACE) 100 MG capsule  . gabapentin (NEURONTIN) 300 MG capsule  . ibuprofen (ADVIL) 800 MG tablet  . ketorolac (TORADOL) 10 MG tablet  . methocarbamol (ROBAXIN) 500 MG tablet  . Multiple Vitamin (MULTIVITAMIN  WITH MINERALS) TABS tablet  . nortriptyline (PAMELOR) 75 MG capsule  . ondansetron (ZOFRAN) 4 MG tablet  . oxyCODONE-acetaminophen (PERCOCET) 5-325 MG tablet  . pantoprazole (PROTONIX) 40 MG tablet  . predniSONE (DELTASONE) 50 MG tablet  . rizatriptan (MAXALT) 10 MG tablet  . zinc sulfate 220 (50 Zn) MG capsule   No current facility-administered medications for this encounter.   Derrill Memo ON 07/10/2020] tranexamic acid (CYKLOKAPRON) 2,000 mg in sodium chloride 0.9 % 50 mL Topical Application  Jaime Solomon is not currently taking albuterol, Oscal with D, Toradol, prednisone, Maxalt, zinc. Reported to PAT RN that ASA to start after surgery.   Myra Gianotti, PA-C Surgical Short Stay/Anesthesiology [facility] Phone [phone] [facility] Phone [phone] 07/07/2020 12:39 PM

## 2020-07-07 NOTE — Anesthesia Preprocedure Evaluation (Addendum)
Anesthesia Evaluation  Patient identified by MRN, date of birth, ID band Patient awake    Reviewed: Allergy & Precautions, NPO status , Patient's Chart, lab work & pertinent test results  Airway Mallampati: I  TM Distance: >3 FB Neck ROM: Full    Dental  (+) Teeth Intact, Dental Advisory Given   Pulmonary neg pulmonary ROS, former smoker,    breath sounds clear to auscultation       Cardiovascular negative cardio ROS   Rhythm:Regular Rate:Normal     Neuro/Psych  Headaches, Anxiety    GI/Hepatic Neg liver ROS, GERD  Medicated,  Endo/Other  negative endocrine ROS  Renal/GU negative Renal ROS     Musculoskeletal negative musculoskeletal ROS (+)   Abdominal (+) + obese,   Peds  Hematology negative hematology ROS (+)   Anesthesia Other Findings   Reproductive/Obstetrics                           Anesthesia Physical Anesthesia Plan  ASA: III  Anesthesia Plan: Spinal   Post-op Pain Management:  Regional for Post-op pain   Induction: Intravenous  PONV Risk Score and Plan: 3 and Ondansetron, Dexamethasone and Midazolam  Airway Management Planned: Natural Airway and Simple Face Mask  Additional Equipment: None  Intra-op Plan:   Post-operative Plan:   Informed Consent: I have reviewed the patients History and Physical, chart, labs and discussed the procedure including the risks, benefits and alternatives for the proposed anesthesia with the patient or authorized representative who has indicated his/her understanding and acceptance.     Dental advisory given  Plan Discussed with: CRNA  Anesthesia Plan Comments: (PAT note written 07/07/2020 by Myra Gianotti, PA-C.  Pt speak with surgeon before block.    Lab Results      Component                Value               Date                      WBC                      7.0                 07/06/2020                HGB                       13.9                07/06/2020                HCT                      43.0                07/06/2020                MCV                      95.1                07/06/2020                PLT  263                 07/06/2020            Lab Results      Component                Value               Date                      INR                      1.0                 07/06/2020            )     Anesthesia Quick Evaluation

## 2020-07-10 ENCOUNTER — Encounter (HOSPITAL_COMMUNITY)
Admission: RE | Disposition: A | Discharge status: Home / Self Care | Payer: Self-pay | Source: Home / Self Care | Attending: Orthopaedic Surgery

## 2020-07-10 ENCOUNTER — Observation Stay (HOSPITAL_COMMUNITY): Payer: Medicare Other

## 2020-07-10 ENCOUNTER — Observation Stay (HOSPITAL_COMMUNITY)
Admission: RE | Admit: 2020-07-10 | Discharge: 2020-07-11 | Disposition: A | Discharge status: Home / Self Care | Payer: Medicare Other | Attending: Orthopaedic Surgery | Admitting: Orthopaedic Surgery

## 2020-07-10 ENCOUNTER — Ambulatory Visit (HOSPITAL_COMMUNITY): Payer: Medicare Other | Admitting: Vascular Surgery

## 2020-07-10 ENCOUNTER — Other Ambulatory Visit: Payer: Self-pay

## 2020-07-10 ENCOUNTER — Ambulatory Visit (HOSPITAL_COMMUNITY): Payer: Medicare Other | Admitting: Certified Registered Nurse Anesthetist

## 2020-07-10 ENCOUNTER — Encounter (HOSPITAL_COMMUNITY): Payer: Self-pay | Admitting: Orthopaedic Surgery

## 2020-07-10 DIAGNOSIS — M25561 Pain in right knee: Secondary | ICD-10-CM | POA: Diagnosis present

## 2020-07-10 DIAGNOSIS — Z7982 Long term (current) use of aspirin: Secondary | ICD-10-CM | POA: Diagnosis not present

## 2020-07-10 DIAGNOSIS — Z853 Personal history of malignant neoplasm of breast: Secondary | ICD-10-CM | POA: Diagnosis not present

## 2020-07-10 DIAGNOSIS — Z96651 Presence of right artificial knee joint: Secondary | ICD-10-CM

## 2020-07-10 DIAGNOSIS — M1711 Unilateral primary osteoarthritis, right knee: Secondary | ICD-10-CM | POA: Diagnosis not present

## 2020-07-10 DIAGNOSIS — Z79899 Other long term (current) drug therapy: Secondary | ICD-10-CM | POA: Diagnosis not present

## 2020-07-10 DIAGNOSIS — Z87891 Personal history of nicotine dependence: Secondary | ICD-10-CM | POA: Insufficient documentation

## 2020-07-10 HISTORY — PX: TOTAL KNEE ARTHROPLASTY: SHX125

## 2020-07-10 SURGERY — ARTHROPLASTY, KNEE, TOTAL
Anesthesia: Spinal | Site: Knee | Laterality: Right

## 2020-07-10 MED ORDER — BUPIVACAINE-EPINEPHRINE (PF) 0.25% -1:200000 IJ SOLN
INTRAMUSCULAR | Status: DC | PRN
  Administered 2020-07-10: 20 mL

## 2020-07-10 MED ORDER — PROPOFOL 10 MG/ML IV BOLUS
INTRAVENOUS | Status: DC | PRN
  Administered 2020-07-10: 50 mg via INTRAVENOUS
  Administered 2020-07-10: 20 mg via INTRAVENOUS
  Administered 2020-07-10: 30 mg via INTRAVENOUS

## 2020-07-10 MED ORDER — DEXAMETHASONE SODIUM PHOSPHATE 10 MG/ML IJ SOLN
INTRAMUSCULAR | Status: DC | PRN
  Administered 2020-07-10: 10 mg via INTRAVENOUS

## 2020-07-10 MED ORDER — PROPOFOL 10 MG/ML IV BOLUS
INTRAVENOUS | Status: AC
  Filled 2020-07-10: qty 20

## 2020-07-10 MED ORDER — ACETAMINOPHEN 325 MG PO TABS: 325.0000 mg | ORAL_TABLET | Freq: Once | ORAL | Status: DC | PRN

## 2020-07-10 MED ORDER — ACETAMINOPHEN 325 MG PO TABS: 325.0000 mg | ORAL_TABLET | Freq: Four times a day (QID) | ORAL | Status: DC | PRN

## 2020-07-10 MED ORDER — BUPIVACAINE IN DEXTROSE 0.75-8.25 % IT SOLN
INTRATHECAL | Status: DC | PRN
  Administered 2020-07-10: 1.6 mL via INTRATHECAL

## 2020-07-10 MED ORDER — ONDANSETRON HCL 4 MG/2ML IJ SOLN
INTRAMUSCULAR | Status: DC | PRN
  Administered 2020-07-10: 4 mg via INTRAVENOUS

## 2020-07-10 MED ORDER — BUPROPION HCL ER (XL) 300 MG PO TB24
300.0000 mg | ORAL_TABLET | Freq: Every day | ORAL | Status: DC
  Administered 2020-07-10 – 2020-07-11 (×2): 300 mg via ORAL
  Filled 2020-07-10 (×2): qty 1

## 2020-07-10 MED ORDER — ONDANSETRON HCL 4 MG/2ML IJ SOLN: 4.0000 mg | Freq: Four times a day (QID) | INTRAMUSCULAR | Status: DC | PRN

## 2020-07-10 MED ORDER — IRRISEPT - 450ML BOTTLE WITH 0.05% CHG IN STERILE WATER, USP 99.95% OPTIME
TOPICAL | Status: DC | PRN
  Administered 2020-07-10: 450 mL via TOPICAL

## 2020-07-10 MED ORDER — OXYCODONE HCL ER 10 MG PO T12A
10.0000 mg | EXTENDED_RELEASE_TABLET | Freq: Two times a day (BID) | ORAL | Status: DC
  Administered 2020-07-10 – 2020-07-11 (×3): 10 mg via ORAL
  Filled 2020-07-10 (×3): qty 1

## 2020-07-10 MED ORDER — VANCOMYCIN HCL 1000 MG IV SOLR
INTRAVENOUS | Status: AC
  Filled 2020-07-10: qty 1000

## 2020-07-10 MED ORDER — LIDOCAINE 2% (20 MG/ML) 5 ML SYRINGE
INTRAMUSCULAR | Status: AC
  Filled 2020-07-10: qty 5

## 2020-07-10 MED ORDER — ONDANSETRON HCL 4 MG/2ML IJ SOLN
INTRAMUSCULAR | Status: AC
  Filled 2020-07-10: qty 2

## 2020-07-10 MED ORDER — VANCOMYCIN HCL 1000 MG IV SOLR
INTRAVENOUS | Status: DC | PRN
  Administered 2020-07-10: 1000 mg

## 2020-07-10 MED ORDER — METOCLOPRAMIDE HCL 5 MG/ML IJ SOLN: 5.0000 mg | Freq: Three times a day (TID) | INTRAMUSCULAR | Status: DC | PRN

## 2020-07-10 MED ORDER — ROPIVACAINE HCL 5 MG/ML IJ SOLN
INTRAMUSCULAR | Status: DC | PRN
  Administered 2020-07-10: 30 mL via PERINEURAL

## 2020-07-10 MED ORDER — MENTHOL 3 MG MT LOZG: 1.0000 | LOZENGE | OROMUCOSAL | Status: DC | PRN

## 2020-07-10 MED ORDER — ASPIRIN 81 MG PO CHEW
81.0000 mg | CHEWABLE_TABLET | Freq: Two times a day (BID) | ORAL | Status: DC
  Administered 2020-07-10 – 2020-07-11 (×2): 81 mg via ORAL
  Filled 2020-07-10 (×2): qty 1

## 2020-07-10 MED ORDER — METHOCARBAMOL 500 MG PO TABS
500.0000 mg | ORAL_TABLET | Freq: Four times a day (QID) | ORAL | Status: DC | PRN
  Administered 2020-07-10 – 2020-07-11 (×2): 500 mg via ORAL
  Filled 2020-07-10 (×2): qty 1

## 2020-07-10 MED ORDER — LACTATED RINGERS IV SOLN: INTRAVENOUS | Status: DC

## 2020-07-10 MED ORDER — AMISULPRIDE (ANTIEMETIC) 5 MG/2ML IV SOLN: 10.0000 mg | Freq: Once | INTRAVENOUS | Status: DC | PRN

## 2020-07-10 MED ORDER — MAGNESIUM CITRATE PO SOLN: 1.0000 | Freq: Once | ORAL | Status: DC | PRN

## 2020-07-10 MED ORDER — CHLORHEXIDINE GLUCONATE 0.12 % MT SOLN
OROMUCOSAL | Status: AC
  Administered 2020-07-10: 15 mL via OROMUCOSAL
  Filled 2020-07-10: qty 15

## 2020-07-10 MED ORDER — MIDAZOLAM HCL 5 MG/5ML IJ SOLN
INTRAMUSCULAR | Status: DC | PRN
  Administered 2020-07-10: 2 mg via INTRAVENOUS

## 2020-07-10 MED ORDER — HYDROMORPHONE HCL 1 MG/ML IJ SOLN: 0.2500 mg | INTRAMUSCULAR | Status: DC | PRN

## 2020-07-10 MED ORDER — METHOCARBAMOL 1000 MG/10ML IJ SOLN: 500.0000 mg | Freq: Four times a day (QID) | INTRAVENOUS | Status: DC | PRN

## 2020-07-10 MED ORDER — PROPOFOL 500 MG/50ML IV EMUL
INTRAVENOUS | Status: DC | PRN
  Administered 2020-07-10: 40 ug/kg/min via INTRAVENOUS

## 2020-07-10 MED ORDER — BUPIVACAINE LIPOSOME 1.3 % IJ SUSP
20.0000 mL | Freq: Once | INTRAMUSCULAR | Status: DC
  Filled 2020-07-10: qty 20

## 2020-07-10 MED ORDER — BUPIVACAINE LIPOSOME 1.3 % IJ SUSP
INTRAMUSCULAR | Status: DC | PRN
  Administered 2020-07-10: 20 mL

## 2020-07-10 MED ORDER — OXYCODONE HCL 5 MG PO TABS
5.0000 mg | ORAL_TABLET | ORAL | Status: DC | PRN
  Administered 2020-07-10 – 2020-07-11 (×6): 10 mg via ORAL
  Filled 2020-07-10 (×6): qty 2

## 2020-07-10 MED ORDER — CITALOPRAM HYDROBROMIDE 20 MG PO TABS
40.0000 mg | ORAL_TABLET | Freq: Every day | ORAL | Status: DC
  Administered 2020-07-10 – 2020-07-11 (×2): 40 mg via ORAL
  Filled 2020-07-10 (×2): qty 2

## 2020-07-10 MED ORDER — POLYETHYLENE GLYCOL 3350 17 G PO PACK: 17.0000 g | PACK | Freq: Every day | ORAL | Status: DC | PRN

## 2020-07-10 MED ORDER — DEXAMETHASONE SODIUM PHOSPHATE 10 MG/ML IJ SOLN
10.0000 mg | Freq: Once | INTRAMUSCULAR | Status: AC
  Administered 2020-07-11: 10 mg via INTRAVENOUS
  Filled 2020-07-10: qty 1

## 2020-07-10 MED ORDER — METHOCARBAMOL 500 MG PO TABS
ORAL_TABLET | ORAL | Status: AC
  Filled 2020-07-10: qty 1

## 2020-07-10 MED ORDER — LIDOCAINE HCL (CARDIAC) PF 100 MG/5ML IV SOSY
PREFILLED_SYRINGE | INTRAVENOUS | Status: DC | PRN
  Administered 2020-07-10: 20 mg via INTRAVENOUS

## 2020-07-10 MED ORDER — ACETAMINOPHEN 500 MG PO TABS
1000.0000 mg | ORAL_TABLET | Freq: Four times a day (QID) | ORAL | Status: AC
  Administered 2020-07-10 – 2020-07-11 (×4): 1000 mg via ORAL
  Filled 2020-07-10 (×4): qty 2

## 2020-07-10 MED ORDER — CEFAZOLIN SODIUM-DEXTROSE 2-4 GM/100ML-% IV SOLN
2.0000 g | Freq: Four times a day (QID) | INTRAVENOUS | Status: AC
  Administered 2020-07-10 (×2): 2 g via INTRAVENOUS
  Filled 2020-07-10 (×2): qty 100

## 2020-07-10 MED ORDER — ACETAMINOPHEN 10 MG/ML IV SOLN
INTRAVENOUS | Status: AC
  Filled 2020-07-10: qty 100

## 2020-07-10 MED ORDER — FENTANYL CITRATE (PF) 100 MCG/2ML IJ SOLN
INTRAMUSCULAR | Status: DC | PRN
  Administered 2020-07-10: 50 ug via INTRAVENOUS

## 2020-07-10 MED ORDER — BRIMONIDINE TARTRATE 0.2 % OP SOLN: 1.0000 [drp] | Freq: Every day | OPHTHALMIC | Status: DC | PRN

## 2020-07-10 MED ORDER — DOCUSATE SODIUM 100 MG PO CAPS
100.0000 mg | ORAL_CAPSULE | Freq: Two times a day (BID) | ORAL | Status: DC
  Administered 2020-07-10 (×2): 100 mg via ORAL
  Filled 2020-07-10 (×3): qty 1

## 2020-07-10 MED ORDER — PHENYLEPHRINE 40 MCG/ML (10ML) SYRINGE FOR IV PUSH (FOR BLOOD PRESSURE SUPPORT)
PREFILLED_SYRINGE | INTRAVENOUS | Status: AC
  Filled 2020-07-10: qty 10

## 2020-07-10 MED ORDER — ACETAMINOPHEN 160 MG/5ML PO SOLN: 325.0000 mg | Freq: Once | ORAL | Status: DC | PRN

## 2020-07-10 MED ORDER — SODIUM CHLORIDE 0.9% FLUSH
INTRAVENOUS | Status: DC | PRN
  Administered 2020-07-10: 20 mL

## 2020-07-10 MED ORDER — SODIUM CHLORIDE 0.9 % IR SOLN
Status: DC | PRN
  Administered 2020-07-10: 3000 mL

## 2020-07-10 MED ORDER — POVIDONE-IODINE 10 % EX SWAB
2.0000 "application " | Freq: Once | CUTANEOUS | Status: AC
  Administered 2020-07-10: 2 via TOPICAL

## 2020-07-10 MED ORDER — PROPOFOL 1000 MG/100ML IV EMUL
INTRAVENOUS | Status: AC
  Filled 2020-07-10: qty 100

## 2020-07-10 MED ORDER — PHENOL 1.4 % MT LIQD: 1.0000 | OROMUCOSAL | Status: DC | PRN

## 2020-07-10 MED ORDER — ALPRAZOLAM 0.5 MG PO TABS: 0.5000 mg | ORAL_TABLET | Freq: Every evening | ORAL | Status: DC | PRN

## 2020-07-10 MED ORDER — 0.9 % SODIUM CHLORIDE (POUR BTL) OPTIME
TOPICAL | Status: DC | PRN
  Administered 2020-07-10: 1000 mL

## 2020-07-10 MED ORDER — FENTANYL CITRATE (PF) 250 MCG/5ML IJ SOLN
INTRAMUSCULAR | Status: AC
  Filled 2020-07-10: qty 5

## 2020-07-10 MED ORDER — ALUM & MAG HYDROXIDE-SIMETH 200-200-20 MG/5ML PO SUSP: 30.0000 mL | ORAL | Status: DC | PRN

## 2020-07-10 MED ORDER — TRANEXAMIC ACID-NACL 1000-0.7 MG/100ML-% IV SOLN
1000.0000 mg | Freq: Once | INTRAVENOUS | Status: AC
  Administered 2020-07-10: 1000 mg via INTRAVENOUS
  Filled 2020-07-10 (×2): qty 100

## 2020-07-10 MED ORDER — TRANEXAMIC ACID-NACL 1000-0.7 MG/100ML-% IV SOLN
1000.0000 mg | INTRAVENOUS | Status: AC
  Administered 2020-07-10: 1000 mg via INTRAVENOUS
  Filled 2020-07-10: qty 100

## 2020-07-10 MED ORDER — BUPIVACAINE LIPOSOME 1.3 % IJ SUSP
20.0000 mL | INTRAMUSCULAR | Status: DC
  Filled 2020-07-10: qty 20

## 2020-07-10 MED ORDER — MEPERIDINE HCL 25 MG/ML IJ SOLN: 6.2500 mg | INTRAMUSCULAR | Status: DC | PRN

## 2020-07-10 MED ORDER — NORTRIPTYLINE HCL 25 MG PO CAPS
150.0000 mg | ORAL_CAPSULE | Freq: Every day | ORAL | Status: DC
  Administered 2020-07-10: 150 mg via ORAL
  Filled 2020-07-10 (×2): qty 6

## 2020-07-10 MED ORDER — DIPHENHYDRAMINE HCL 12.5 MG/5ML PO ELIX
25.0000 mg | ORAL_SOLUTION | ORAL | Status: DC | PRN
  Filled 2020-07-10: qty 10

## 2020-07-10 MED ORDER — SORBITOL 70 % SOLN
30.0000 mL | Freq: Every day | Status: DC | PRN
  Filled 2020-07-10: qty 30

## 2020-07-10 MED ORDER — PHENYLEPHRINE HCL-NACL 10-0.9 MG/250ML-% IV SOLN
INTRAVENOUS | Status: DC | PRN
  Administered 2020-07-10: 50 ug/min via INTRAVENOUS

## 2020-07-10 MED ORDER — GABAPENTIN 300 MG PO CAPS
300.0000 mg | ORAL_CAPSULE | Freq: Two times a day (BID) | ORAL | Status: DC
  Administered 2020-07-10 – 2020-07-11 (×3): 300 mg via ORAL
  Filled 2020-07-10 (×3): qty 1

## 2020-07-10 MED ORDER — MIDAZOLAM HCL 2 MG/2ML IJ SOLN
INTRAMUSCULAR | Status: AC
  Filled 2020-07-10: qty 2

## 2020-07-10 MED ORDER — ONDANSETRON HCL 4 MG PO TABS: 4.0000 mg | ORAL_TABLET | Freq: Four times a day (QID) | ORAL | Status: DC | PRN

## 2020-07-10 MED ORDER — ORAL CARE MOUTH RINSE: 15.0000 mL | Freq: Once | OROMUCOSAL | Status: AC

## 2020-07-10 MED ORDER — KETOROLAC TROMETHAMINE 15 MG/ML IJ SOLN
15.0000 mg | Freq: Four times a day (QID) | INTRAMUSCULAR | Status: AC
  Administered 2020-07-10 – 2020-07-11 (×4): 15 mg via INTRAVENOUS
  Filled 2020-07-10 (×4): qty 1

## 2020-07-10 MED ORDER — BUPIVACAINE-EPINEPHRINE (PF) 0.25% -1:200000 IJ SOLN
INTRAMUSCULAR | Status: AC
  Filled 2020-07-10: qty 20

## 2020-07-10 MED ORDER — CEFAZOLIN SODIUM-DEXTROSE 2-4 GM/100ML-% IV SOLN
2.0000 g | INTRAVENOUS | Status: AC
  Administered 2020-07-10: 2 g via INTRAVENOUS
  Filled 2020-07-10: qty 100

## 2020-07-10 MED ORDER — HYDROMORPHONE HCL 1 MG/ML IJ SOLN
0.5000 mg | INTRAMUSCULAR | Status: DC | PRN
  Administered 2020-07-10: 1 mg via INTRAVENOUS
  Filled 2020-07-10: qty 1

## 2020-07-10 MED ORDER — CHLORHEXIDINE GLUCONATE 0.12 % MT SOLN: 15.0000 mL | Freq: Once | OROMUCOSAL | Status: AC

## 2020-07-10 MED ORDER — METOCLOPRAMIDE HCL 5 MG PO TABS: 5.0000 mg | ORAL_TABLET | Freq: Three times a day (TID) | ORAL | Status: DC | PRN

## 2020-07-10 MED ORDER — TRANEXAMIC ACID 1000 MG/10ML IV SOLN
2000.0000 mg | INTRAVENOUS | Status: DC
  Filled 2020-07-10: qty 20

## 2020-07-10 MED ORDER — SODIUM CHLORIDE 0.9 % IV SOLN: INTRAVENOUS | Status: DC

## 2020-07-10 MED ORDER — ACETAMINOPHEN 10 MG/ML IV SOLN
1000.0000 mg | Freq: Once | INTRAVENOUS | Status: DC | PRN
  Administered 2020-07-10: 1000 mg via INTRAVENOUS

## 2020-07-10 MED ORDER — PHENYLEPHRINE HCL (PRESSORS) 10 MG/ML IV SOLN
INTRAVENOUS | Status: DC | PRN
  Administered 2020-07-10: 80 ug via INTRAVENOUS
  Administered 2020-07-10: 40 ug via INTRAVENOUS
  Administered 2020-07-10 (×5): 80 ug via INTRAVENOUS

## 2020-07-10 MED ORDER — PANTOPRAZOLE SODIUM 40 MG PO TBEC
40.0000 mg | DELAYED_RELEASE_TABLET | Freq: Two times a day (BID) | ORAL | Status: DC
  Administered 2020-07-10 – 2020-07-11 (×3): 40 mg via ORAL
  Filled 2020-07-10 (×3): qty 1

## 2020-07-10 MED ORDER — OXYCODONE HCL 5 MG PO TABS: 10.0000 mg | ORAL_TABLET | ORAL | Status: DC | PRN

## 2020-07-10 SURGICAL SUPPLY — 76 items
ALCOHOL 70% 16 OZ (MISCELLANEOUS) ×2 IMPLANT
BAG DECANTER FOR FLEXI CONT (MISCELLANEOUS) ×2 IMPLANT
BANDAGE ESMARK 6X9 LF (GAUZE/BANDAGES/DRESSINGS) IMPLANT
BLADE SAG 18X100X1.27 (BLADE) ×2 IMPLANT
BNDG ESMARK 6X9 LF (GAUZE/BANDAGES/DRESSINGS)
BOWL SMART MIX CTS (DISPOSABLE) ×2 IMPLANT
CLSR STERI-STRIP ANTIMIC 1/2X4 (GAUZE/BANDAGES/DRESSINGS) ×4 IMPLANT
COMP FEM PERS SZ 8 RT (Joint) ×2 IMPLANT
COMP TIB PERS SZ D RT (Joint) ×2 IMPLANT
COMPONENT FEM PERS SZ 8 RT (Joint) ×1 IMPLANT
COMPONENT TIB PERS SZ D RT (Joint) ×1 IMPLANT
COOLER ICEMAN CLASSIC (MISCELLANEOUS) ×2 IMPLANT
COVER SURGICAL LIGHT HANDLE (MISCELLANEOUS) ×2 IMPLANT
COVER WAND RF STERILE (DRAPES) IMPLANT
CUFF TOURN SGL QUICK 34 (TOURNIQUET CUFF) ×1
CUFF TOURN SGL QUICK 42 (TOURNIQUET CUFF) IMPLANT
CUFF TRNQT CYL 34X4.125X (TOURNIQUET CUFF) ×1 IMPLANT
DERMABOND ADVANCED (GAUZE/BANDAGES/DRESSINGS) ×1
DERMABOND ADVANCED .7 DNX12 (GAUZE/BANDAGES/DRESSINGS) ×1 IMPLANT
DRAPE EXTREMITY T 121X128X90 (DISPOSABLE) ×2 IMPLANT
DRAPE HALF SHEET 40X57 (DRAPES) ×2 IMPLANT
DRAPE INCISE IOBAN 66X45 STRL (DRAPES) IMPLANT
DRAPE ORTHO SPLIT 77X108 STRL (DRAPES) ×2
DRAPE POUCH INSTRU U-SHP 10X18 (DRAPES) ×2 IMPLANT
DRAPE SURG ORHT 6 SPLT 77X108 (DRAPES) ×2 IMPLANT
DRAPE U-SHAPE 47X51 STRL (DRAPES) ×4 IMPLANT
DRSG AQUACEL AG ADV 3.5X10 (GAUZE/BANDAGES/DRESSINGS) ×2 IMPLANT
DURAPREP 26ML APPLICATOR (WOUND CARE) ×6 IMPLANT
ELECT CAUTERY BLADE 6.4 (BLADE) ×2 IMPLANT
ELECT REM PT RETURN 9FT ADLT (ELECTROSURGICAL) ×2
ELECTRODE REM PT RTRN 9FT ADLT (ELECTROSURGICAL) ×1 IMPLANT
GLOVE BIOGEL PI IND STRL 7.0 (GLOVE) ×1 IMPLANT
GLOVE BIOGEL PI INDICATOR 7.0 (GLOVE) ×1
GLOVE ECLIPSE 7.0 STRL STRAW (GLOVE) ×6 IMPLANT
GLOVE SKINSENSE NS SZ7.5 (GLOVE) ×3
GLOVE SKINSENSE STRL SZ7.5 (GLOVE) ×3 IMPLANT
GLOVE SURG SYN 7.5  E (GLOVE) ×4
GLOVE SURG SYN 7.5 E (GLOVE) ×4 IMPLANT
GOWN STRL REIN XL XLG (GOWN DISPOSABLE) ×2 IMPLANT
GOWN STRL REUS W/ TWL LRG LVL3 (GOWN DISPOSABLE) ×1 IMPLANT
GOWN STRL REUS W/TWL LRG LVL3 (GOWN DISPOSABLE) ×1
HANDPIECE INTERPULSE COAX TIP (DISPOSABLE) ×1
HOOD PEEL AWAY FLYTE STAYCOOL (MISCELLANEOUS) ×4 IMPLANT
IMPL PATELLA METAL SZ32X10 (Joint) ×2 IMPLANT
INSERT TIB PERS SZ 8-9X13 RT (Insert) ×2 IMPLANT
JET LAVAGE IRRISEPT WOUND (IRRIGATION / IRRIGATOR) ×2
KIT BASIN OR (CUSTOM PROCEDURE TRAY) ×2 IMPLANT
KIT TURNOVER KIT B (KITS) ×2 IMPLANT
LAVAGE JET IRRISEPT WOUND (IRRIGATION / IRRIGATOR) ×1 IMPLANT
MANIFOLD NEPTUNE II (INSTRUMENTS) ×2 IMPLANT
MARKER SKIN DUAL TIP RULER LAB (MISCELLANEOUS) ×2 IMPLANT
NEEDLE SPNL 18GX3.5 QUINCKE PK (NEEDLE) ×4 IMPLANT
NS IRRIG 1000ML POUR BTL (IV SOLUTION) ×2 IMPLANT
PACK TOTAL JOINT (CUSTOM PROCEDURE TRAY) ×2 IMPLANT
PAD ARMBOARD 7.5X6 YLW CONV (MISCELLANEOUS) ×4 IMPLANT
PAD COLD SHLDR WRAP-ON (PAD) ×2 IMPLANT
SAW OSC TIP CART 19.5X105X1.3 (SAW) ×2 IMPLANT
SET HNDPC FAN SPRY TIP SCT (DISPOSABLE) ×1 IMPLANT
STAPLER VISISTAT 35W (STAPLE) IMPLANT
STRIP CLOSURE SKIN 1/2X4 (GAUZE/BANDAGES/DRESSINGS) ×4 IMPLANT
SUCTION FRAZIER HANDLE 10FR (MISCELLANEOUS) ×1
SUCTION TUBE FRAZIER 10FR DISP (MISCELLANEOUS) ×1 IMPLANT
SUT ETHILON 2 0 FS 18 (SUTURE) IMPLANT
SUT MNCRL AB 3-0 PS2 27 (SUTURE) ×2 IMPLANT
SUT MNCRL AB 4-0 PS2 18 (SUTURE) IMPLANT
SUT VIC AB 0 CT1 27 (SUTURE) ×2
SUT VIC AB 0 CT1 27XBRD ANBCTR (SUTURE) ×2 IMPLANT
SUT VIC AB 1 CTX 27 (SUTURE) ×6 IMPLANT
SUT VIC AB 2-0 CT1 27 (SUTURE) ×5
SUT VIC AB 2-0 CT1 TAPERPNT 27 (SUTURE) ×5 IMPLANT
SYR 50ML LL SCALE MARK (SYRINGE) ×4 IMPLANT
TOWEL GREEN STERILE (TOWEL DISPOSABLE) ×2 IMPLANT
TOWEL GREEN STERILE FF (TOWEL DISPOSABLE) ×2 IMPLANT
TRAY CATH 16FR W/PLASTIC CATH (SET/KITS/TRAYS/PACK) IMPLANT
UNDERPAD 30X36 HEAVY ABSORB (UNDERPADS AND DIAPERS) ×2 IMPLANT
WRAP KNEE MAXI GEL POST OP (GAUZE/BANDAGES/DRESSINGS) ×2 IMPLANT

## 2020-07-10 NOTE — Transfer of Care (Addendum)
Immediate Anesthesia Transfer of Care Note  Patient: Jaime Solomon  Procedure(s) Performed: RIGHT TOTAL KNEE ARTHROPLASTY (Right Knee)  Patient Location: PACU  Anesthesia Type:Spinal  Level of Consciousness: awake, alert  and patient cooperative  Airway & Oxygen Therapy: Patient Spontanous Breathing and Patient connected to nasal cannula oxygen  Post-op Assessment: Report given to RN and Post -op Vital signs reviewed and stable  Post vital signs: Reviewed and stable  Last Vitals:  Vitals Value Taken Time  BP 115/65 07/10/20 1020  Temp 36.6 C 07/10/20 0950  Pulse 81 07/10/20 1025  Resp 8 07/10/20 1025  SpO2 98 % 07/10/20 1025  Vitals shown include unvalidated device data.  Last Pain:  Vitals:   07/10/20 0950  TempSrc:   PainSc: 0-No pain      Patients Stated Pain Goal: 4 (73/71/06 2694)  Complications: No complications documented.

## 2020-07-10 NOTE — Anesthesia Procedure Notes (Signed)
Procedure Name: MAC Date/Time: 07/10/2020 7:30 AM Performed by: Michele Rockers, CRNA Pre-anesthesia Checklist: Patient identified, Emergency Drugs available, Suction available, Timeout performed and Patient being monitored Patient Re-evaluated:Patient Re-evaluated prior to induction Oxygen Delivery Method: Simple face mask

## 2020-07-10 NOTE — Discharge Instructions (Signed)

## 2020-07-10 NOTE — Anesthesia Postprocedure Evaluation (Signed)
Anesthesia Post Note  Patient: Jaime Solomon  Procedure(s) Performed: RIGHT TOTAL KNEE ARTHROPLASTY (Right Knee)     Patient location during evaluation: PACU Anesthesia Type: Spinal Level of consciousness: oriented and awake and alert Pain management: pain level controlled Vital Signs Assessment: post-procedure vital signs reviewed and stable Respiratory status: spontaneous breathing, respiratory function stable and patient connected to nasal cannula oxygen Cardiovascular status: blood pressure returned to baseline and stable Postop Assessment: no headache, no backache, no apparent nausea or vomiting and spinal receding Anesthetic complications: no   No complications documented.  Last Vitals:  Vitals:   07/10/20 1030 07/10/20 1059  BP:  114/66  Pulse: 81 84  Resp: 14 16  Temp:  36.8 C  SpO2: 93% 97%    Last Pain:  Vitals:   07/10/20 1059  TempSrc: Oral  PainSc:                  Effie Berkshire

## 2020-07-10 NOTE — Anesthesia Procedure Notes (Signed)
Spinal  Start time: 07/10/2020 7:33 AM End time: 07/10/2020 7:37 AM Staffing Performed: anesthesiologist  Anesthesiologist: Effie Berkshire, MD Preanesthetic Checklist Completed: patient identified, IV checked, site marked, risks and benefits discussed, surgical consent, monitors and equipment checked, pre-op evaluation and timeout performed Spinal Block Patient position: sitting Prep: DuraPrep and site prepped and draped Location: L2-3 Injection technique: single-shot Needle Needle type: Pencan  Needle gauge: 24 G Needle length: 10 cm Needle insertion depth: 10 cm Additional Notes Patient tolerated well. No immediate complications.  Lumbar fusion L3-S1

## 2020-07-10 NOTE — Op Note (Signed)
Total Knee Arthroplasty Procedure Note  Preoperative diagnosis: Right knee osteoarthritis  Postoperative diagnosis:same  Operative procedure: Right total knee arthroplasty. CPT [phone]  Surgeon: N. Eduard Roux, MD  Assist: Madalyn Rob, PA-C; necessary for the timely completion of procedure and due to complexity of procedure.  Anesthesia: Spinal, regional  Tourniquet time: 43 mins  Implants used: Zimmer persona uncemented Femur: CR 8 Tibia: D Patella: 32 mm Polyethylene: 13 mm, MC  Indication: Jaime Solomon is a 59 y.o. year old female with a history of knee pain. Having failed conservative management, the patient elected to proceed with a total knee arthroplasty.  We have reviewed the risk and benefits of the surgery and they elected to proceed after voicing understanding.  Procedure:  After informed consent was obtained and understanding of the risk were voiced including but not limited to bleeding, infection, damage to surrounding structures including nerves and vessels, blood clots, leg length inequality and the failure to achieve desired results, the operative extremity was marked with verbal confirmation of the patient in the holding area.   The patient was then brought to the operating room and transported to the operating room table in the supine position.  A tourniquet was applied to the operative extremity around the upper thigh. The operative limb was then prepped and draped in the usual sterile fashion and preoperative antibiotics were administered.  A time out was performed prior to the start of surgery confirming the correct extremity, preoperative antibiotic administration, as well as team members, implants and instruments available for the case. Correct surgical site was also confirmed with preoperative radiographs. The limb was then elevated for exsanguination and the tourniquet was inflated. A midline incision was made and a standard medial parapatellar  approach was performed.  The patella was prepared and sized to a 32 mm.  A cover was placed on the patella for protection from retractors.  We then turned our attention to the femur. Posterior cruciate ligament was sacrificed. Start site was drilled in the femur and the intramedullary distal femoral cutting guide was placed, set at 5 degrees valgus, taking 10 mm of distal resection. The distal cut was made. Osteophytes were then removed.  Extension gap was then checked.   Next, the proximal tibial cutting guide was placed with appropriate slope, varus/valgus alignment and depth of resection. The proximal tibial cut was made. Gap blocks were then used to assess the extension gap and alignment, and appropriate soft tissue releases were performed. Attention was turned back to the femur, which was sized using the sizing guide to a size 8. Appropriate rotation of the femoral component was determined using epicondylar axis, Whiteside's line, and assessing the flexion gap under ligament tension. The appropriate size 4-in-1 cutting block was placed and cuts were made.  Posterior femoral osteophytes and uncapped bone were then removed with the curved osteotome.  Trial components were placed, and stability was checked in full extension, mid-flexion, and deep flexion. Proper tibial rotation was determined and marked.  The patella tracked well without a lateral release. Trial components were then removed and tibial preparation performed.  The tibia was sized for a size D component.  A posterior capsular injection comprising of 20 cc of 1.3% exparel, 20 cc of 0.25% bupivicaine with epi and 20 cc of normal saline was performed for postoperative pain control. The bony surfaces were irrigated with a pulse lavage and then dried.  The bone quality was excellent.  The stability of the construct was re-evaluated throughout  a range of motion and found to be acceptable. The trial liner was removed, the knee was copiously irrigated,  and the knee was re-evaluated for any excess bone debris. The real polyethylene liner, 13 mm thick, was inserted and checked to ensure the locking mechanism had engaged appropriately. The tourniquet was deflated and hemostasis was achieved. The wound was irrigated with irrisept.  One gram of vancomycin powder was placed in the surgical bed.  Capsular closure was performed with a #1 vicryl, subcutaneous fat closed with a 0 vicryl suture, then subcutaneous tissue closed with interrupted 2.0 vicryl suture. The skin was then closed with a 4.0 monocryl and steri strips. A sterile dressing was applied.  The patient was awakened in the operating room and taken to recovery in stable condition. All sponge, needle, and instrument counts were correct at the end of the case.  Position: supine  Complications: none.  Time Out: performed   Drains/Packing: none  Estimated blood loss: minimal  Returned to Recovery Room: in good condition.   Antibiotics: yes   Mechanical VTE (DVT) Prophylaxis: sequential compression devices, TED thigh-high  Chemical VTE (DVT) Prophylaxis: aspirin  Fluid Replacement  Crystalloid: see anesthesia record Blood: none  FFP: none   Specimens Removed: 1 to pathology   Sponge and Instrument Count Correct? yes   PACU: portable radiograph - knee AP and Lateral   Plan/RTC: Return in 2 weeks for wound check.   Weight Bearing/Load Lower Extremity: full   N. Eduard Roux, MD [facility] 8:51 AM

## 2020-07-10 NOTE — Progress Notes (Addendum)
Orthopedic Tech Progress Note Patient Details:  Jaime Solomon [birth year] [id number] RN called requesting an Hood with TRAPZE CPM Right Knee CPM Right Knee: On Right Knee Flexion (Degrees): 50 Right Knee Extension (Degrees): 0 Additional Comments: added ice  Post Interventions Patient Tolerated: Fair Instructions Provided: Care of device  Janit Pagan 07/10/2020, 11:18 AM

## 2020-07-10 NOTE — Anesthesia Procedure Notes (Signed)
Anesthesia Regional Block: Adductor canal block   Pre-Anesthetic Checklist: ,, timeout performed, Correct Patient, Correct Site, Correct Laterality, Correct Procedure, Correct Position, site marked, Risks and benefits discussed,  Surgical consent,  Pre-op evaluation,  At surgeon's request and post-op pain management  Laterality: Right  Prep: chloraprep       Needles:  Injection technique: Single-shot  Needle Type: Echogenic Stimulator Needle     Needle Length: 9cm  Needle Gauge: 21     Additional Needles:   Procedures:,,,, ultrasound used (permanent image in chart),,,,  Narrative:  Start time: 07/10/2020 7:10 AM End time: 07/10/2020 7:15 AM Injection made incrementally with aspirations every 5 mL.  Performed by: Personally  Anesthesiologist: Effie Berkshire, MD  Additional Notes: Patient tolerated the procedure well. Local anesthetic introduced in an incremental fashion under minimal resistance after negative aspirations. No paresthesias were elicited. After completion of the procedure, no acute issues were identified and patient continued to be monitored by RN.

## 2020-07-10 NOTE — Evaluation (Signed)
Physical Therapy Evaluation Patient Details Name: Jaime Solomon MRN: [id number] DOB: 07-27-1961 Today's Date: 07/10/2020   History of Present Illness  Pt is a 59 y/o female s/p R TKA. PMH includesbreast cancer, cerebral aneurysm and back surgery.   Clinical Impression  Pt is s/p surgery above with deficits below. Pt requiring min guard A to stand and ambulate within the room. Pt reporting increased pain which limited mobility. Educated about knee precautions. Will continue to follow acutely to maximize functional mobility independence and safety.     Follow Up Recommendations Follow surgeon's recommendation for DC plan and follow-up therapies;Supervision for mobility/OOB    Equipment Recommendations  None recommended by PT    Recommendations for Other Services       Precautions / Restrictions Precautions Precautions: Knee Precaution Booklet Issued: No Precaution Comments: Verbally reviewed knee precautions.  Restrictions Weight Bearing Restrictions: Yes RLE Weight Bearing: Weight bearing as tolerated      Mobility  Bed Mobility Overal bed mobility: Needs Assistance Bed Mobility: Supine to Sit     Supine to sit: Supervision     General bed mobility comments: Supervision for safety. Increased time required.     Transfers Overall transfer level: Needs assistance Equipment used: Rolling walker (2 wheeled) Transfers: Sit to/from Stand Sit to Stand: Min guard         General transfer comment: Min guard for safety. Cues for safe hand placement.   Ambulation/Gait Ambulation/Gait assistance: Min guard Gait Distance (Feet): 20 Feet Assistive device: Rolling walker (2 wheeled) Gait Pattern/deviations: Step-to pattern;Decreased step length - right;Decreased step length - left;Antalgic;Decreased weight shift to right Gait velocity: Decreased   General Gait Details: Slow, antalgic gait. Pt reporting increased pain, so distance limited to bathroom and back to recliner.  Cues for sequencing using RW.   Stairs            Wheelchair Mobility    Modified Rankin (Stroke Patients Only)       Balance Overall balance assessment: Needs assistance Sitting-balance support: No upper extremity supported;Feet supported Sitting balance-Leahy Scale: Good     Standing balance support: Bilateral upper extremity supported;During functional activity Standing balance-Leahy Scale: Poor Standing balance comment: Reliant on BUE Support                              Pertinent Vitals/Pain Pain Assessment: Faces Faces Pain Scale: Hurts even more Pain Location: R knee  Pain Descriptors / Indicators: Aching;Operative site guarding Pain Intervention(s): Limited activity within patient's tolerance;Monitored during session;Repositioned    Home Living Family/patient expects to be discharged to:: Private residence Living Arrangements: Alone Available Help at Discharge: Friend(s);Available PRN/intermittently Type of Home: House Home Access: Stairs to enter Entrance Stairs-Rails: None Entrance Stairs-Number of Steps: 2 Home Layout: One level Home Equipment: Toilet riser;Shower seat;Walker - 2 wheels      Prior Function Level of Independence: Independent               Hand Dominance        Extremity/Trunk Assessment   Upper Extremity Assessment Upper Extremity Assessment: Overall WFL for tasks assessed    Lower Extremity Assessment Lower Extremity Assessment: RLE deficits/detail RLE Deficits / Details: Deficits consistent with post op pain and weakness.     Cervical / Trunk Assessment Cervical / Trunk Assessment: Normal  Communication      Cognition Arousal/Alertness: Awake/alert Behavior During Therapy: WFL for tasks assessed/performed Overall Cognitive Status: Within Functional Limits for  tasks assessed                                        General Comments      Exercises General Exercises - Lower  Extremity Ankle Circles/Pumps: AROM;Both;10 reps;Seated   Assessment/Plan    PT Assessment Patient needs continued PT services  PT Problem List Decreased strength;Decreased range of motion;Decreased activity tolerance;Decreased balance;Decreased mobility;Decreased knowledge of use of DME;Pain       PT Treatment Interventions DME instruction;Gait training;Functional mobility training;Therapeutic exercise;Balance training;Therapeutic activities;Stair training;Patient/family education    PT Goals (Current goals can be found in the Care Plan section)  Acute Rehab PT Goals Patient Stated Goal: to go home PT Goal Formulation: With patient Time For Goal Achievement: 07/24/20 Potential to Achieve Goals: Good    Frequency 7X/week   Barriers to discharge        Co-evaluation               AM-PAC PT "6 Clicks" Mobility  Outcome Measure Help needed turning from your back to your side while in a flat bed without using bedrails?: None Help needed moving from lying on your back to sitting on the side of a flat bed without using bedrails?: None Help needed moving to and from a bed to a chair (including a wheelchair)?: A Little Help needed standing up from a chair using your arms (e.g., wheelchair or bedside chair)?: A Little Help needed to walk in hospital room?: A Little Help needed climbing 3-5 steps with a railing? : A Lot 6 Click Score: 19    End of Session Equipment Utilized During Treatment: Gait belt Activity Tolerance: Patient limited by pain Patient left: in chair;with call bell/phone within reach Nurse Communication: Mobility status PT Visit Diagnosis: Other abnormalities of gait and mobility (R26.89);Muscle weakness (generalized) (M62.81);Difficulty in walking, not elsewhere classified (R26.2);Pain Pain - Right/Left: Right Pain - part of body: Knee    Time: 6659-9357 PT Time Calculation (min) (ACUTE ONLY): 21 min   Charges:   PT Evaluation $PT Eval Low Complexity:  1 Low          Lou Miner, DPT  Acute Rehabilitation Services  Pager: [phone] Office: [phone]   Rudean Hitt 07/10/2020, 1:02 PM

## 2020-07-10 NOTE — H&P (Signed)
PREOPERATIVE H&P  Chief Complaint: right knee degenerative joint disease  HPI: Jaime Solomon is a 59 y.o. female who presents for surgical treatment of right knee degenerative joint disease.  She denies any changes in medical history.  Past Medical History:  Diagnosis Date  . Anxiety   . Breast cancer ([place]) 2006  . Breast cancer ([place]) 05/22/2012  . Cerebral aneurysm without rupture 2010  . GERD (gastroesophageal reflux disease)   . Headache   . Personal history of radiation therapy    Past Surgical History:  Procedure Laterality Date  . ANEURYSM COILING  2010   coiling and stenting for cerebral aneurysm  . angiography  2010   head  . BACK SURGERY  2020   L3-S1 Laminectomy and fusion - plate and 8 screws  . BREAST LUMPECTOMY Right 2006  . CHOLECYSTECTOMY    . MASTECTOMY PARTIAL / LUMPECTOMY  2006   right   . ORIF HUMERUS FRACTURE Left 06/21/2019   Procedure: OPEN REDUCTION INTERNAL FIXATION (ORIF) LEFT HUMERAL SHAFT FRACTURE;  Surgeon: Leandrew Koyanagi, MD;  Location: [facility];  Service: Orthopedics;  Laterality: Left;  . PARTIAL HYSTERECTOMY  1991  . ROTATOR CUFF REPAIR Right    Social History   Socioeconomic History  . Marital status: Divorced    Spouse name: Not on file  . Number of children: Not on file  . Years of education: Not on file  . Highest education level: Not on file  Occupational History  . Not on file  Tobacco Use  . Smoking status: Former Smoker    Quit date: 05/22/2005    Years since quitting: 15.1  . Smokeless tobacco: Never Used  Vaping Use  . Vaping Use: Never used  Substance and Sexual Activity  . Alcohol use: Yes    Alcohol/week: 2.0 standard drinks    Types: 2 Glasses of wine per week  . Drug use: No  . Sexual activity: Not Currently  Other Topics Concern  . Not on file  Social History Narrative  . Not on file   Social Determinants of Health   Financial Resource Strain:   . Difficulty of Paying Living Expenses: Not on file  Food  Insecurity:   . Worried About Charity fundraiser in the Last Year: Not on file  . Ran Out of Food in the Last Year: Not on file  Transportation Needs:   . Lack of Transportation (Medical): Not on file  . Lack of Transportation (Non-Medical): Not on file  Physical Activity:   . Days of Exercise per Week: Not on file  . Minutes of Exercise per Session: Not on file  Stress:   . Feeling of Stress : Not on file  Social Connections:   . Frequency of Communication with Friends and Family: Not on file  . Frequency of Social Gatherings with Friends and Family: Not on file  . Attends Religious Services: Not on file  . Active Member of Clubs or Organizations: Not on file  . Attends Archivist Meetings: Not on file  . Marital Status: Not on file   Family History  Problem Relation Age of Onset  . Diabetes Father   . Hypertension Father    Allergies  Allergen Reactions  . Depakote [Divalproex Sodium] Other (See Comments)    hallucinations  . Topamax [Topiramate] Other (See Comments)    "feel like I'm in a fog, cant talk"   Prior to Admission medications   Medication Sig Start  Date End Date Taking? Authorizing Provider  acetaminophen-codeine (TYLENOL #3) 300-30 MG tablet Take 1 tablet by mouth 3 (three) times daily as needed for moderate pain. 06/19/20  Yes Aundra Dubin, PA-C  ALPRAZolam Duanne Moron) 0.5 MG tablet Take 0.5 mg by mouth at bedtime as needed for sleep.    Yes [provider]  Brimonidine Tartrate (LUMIFY) 0.025 % SOLN Place 1 drop into both eyes daily as needed (redness).   Yes [provider]  buPROPion (WELLBUTRIN XL) 300 MG 24 hr tablet Take 300 mg by mouth daily.   Yes [provider]  citalopram (CELEXA) 40 MG tablet Take 40 mg by mouth daily.    Yes [provider]  docusate sodium (COLACE) 100 MG capsule Take 1 capsule (100 mg total) by mouth daily as needed. 07/05/20 07/05/21 Yes Aundra Dubin, PA-C  gabapentin (NEURONTIN)  300 MG capsule Take 300 mg by mouth 2 (two) times daily.    Yes [provider]  ibuprofen (ADVIL) 800 MG tablet Take 800 mg by mouth every 8 (eight) hours as needed for headache.   Yes [provider]  methocarbamol (ROBAXIN) 500 MG tablet Take 1 tablet (500 mg total) by mouth 2 (two) times daily as needed. 07/05/20  Yes Aundra Dubin, PA-C  Multiple Vitamin (MULTIVITAMIN WITH MINERALS) TABS tablet Take 1 tablet by mouth daily.   Yes [provider]  nortriptyline (PAMELOR) 75 MG capsule Take 150 mg by mouth at bedtime.    Yes [provider]  oxyCODONE-acetaminophen (PERCOCET) 5-325 MG tablet Take 1-2 tablets by mouth every 6 (six) hours as needed. 07/05/20  Yes Aundra Dubin, PA-C  pantoprazole (PROTONIX) 40 MG tablet Take 40 mg by mouth 2 (two) times daily.    Yes [provider]  albuterol (VENTOLIN HFA) 108 (90 Base) MCG/ACT inhaler Inhale 2 puffs into the lungs every 4 (four) hours as needed for wheezing or shortness of breath (or coughing). Patient not taking: Reported on 61/95/0932 6/71/24   Delora Fuel, MD  aspirin EC 81 MG tablet Take 1 tablet (81 mg total) by mouth 2 (two) times daily. 07/05/20   Aundra Dubin, PA-C  calcium-vitamin D (OSCAL WITH D) 500-200 MG-UNIT tablet Take 1 tablet by mouth 3 (three) times daily. Patient not taking: Reported on 06/30/2020 06/22/19   Aundra Dubin, PA-C  diphenhydrAMINE (SOMINEX) 25 MG tablet Take 50 mg by mouth at bedtime as needed for sleep.     [provider]  ketorolac (TORADOL) 10 MG tablet Take 1 tablet (10 mg total) by mouth 2 (two) times daily as needed. Patient not taking: Reported on 06/30/2020 06/21/19   Leandrew Koyanagi, MD  ondansetron (ZOFRAN) 4 MG tablet Take 1 tablet (4 mg total) by mouth every 8 (eight) hours as needed for nausea or vomiting. 07/05/20   Aundra Dubin, PA-C  predniSONE (DELTASONE) 50 MG tablet Take 1 tablet (50 mg total) by mouth daily. Patient not taking:  Reported on 58/05/9832 04/26/04   Delora Fuel, MD  rizatriptan (MAXALT) 10 MG tablet Take 10 mg by mouth as needed for migraine. May repeat in 2 hours if needed  Patient not taking: Reported on 06/30/2020    [provider]  zinc sulfate 220 (50 Zn) MG capsule Take 1 capsule (220 mg total) by mouth daily. Patient not taking: Reported on 06/30/2020 06/22/19   Aundra Dubin, PA-C     Positive ROS: All other systems have been reviewed and were otherwise  negative with the exception of those mentioned in the HPI and as above.  Physical Exam: General: Alert, no acute distress Cardiovascular: No pedal edema Respiratory: No cyanosis, no use of accessory musculature GI: abdomen soft Skin: No lesions in the area of chief complaint Neurologic: Sensation intact distally Psychiatric: Patient is competent for consent with normal mood and affect Lymphatic: no lymphedema  MUSCULOSKELETAL: exam stable  Assessment: right knee degenerative joint disease  Plan: Plan for Procedure(s): RIGHT TOTAL KNEE ARTHROPLASTY  The risks benefits and alternatives were discussed with the patient including but not limited to the risks of nonoperative treatment, versus surgical intervention including infection, bleeding, nerve injury,  blood clots, cardiopulmonary complications, morbidity, mortality, among others, and they were willing to proceed.   Preoperative templating of the joint replacement has been completed, documented, and submitted to the Operating Room personnel in order to optimize intra-operative equipment management.   Eduard Roux, MD 07/10/2020 7:06 AM

## 2020-07-11 ENCOUNTER — Encounter (HOSPITAL_COMMUNITY): Payer: Self-pay | Admitting: Orthopaedic Surgery

## 2020-07-11 DIAGNOSIS — M1711 Unilateral primary osteoarthritis, right knee: Secondary | ICD-10-CM | POA: Diagnosis not present

## 2020-07-11 LAB — BASIC METABOLIC PANEL
Anion gap: 10 (ref 5–15)
BUN: 6 mg/dL — ABNORMAL LOW (ref 8–23)
CO2: 28 mmol/L (ref 22–32)
Calcium: 9.7 mg/dL (ref 8.9–10.3)
Chloride: 101 mmol/L (ref 98–111)
Creatinine, Ser: 0.86 mg/dL (ref 0.44–1.00)
GFR, Estimated: >60 mL/min (ref >60)
Glucose, Bld: 128 mg/dL — ABNORMAL HIGH (ref 70–99)
Potassium: 4.4 mmol/L (ref 3.5–5.1)
Sodium: 139 mmol/L (ref 135–145)

## 2020-07-11 LAB — CBC
HCT: 37.6 % (ref 36.0–46.0)
Hemoglobin: 12.3 g/dL (ref 12.0–15.0)
MCH: 31.4 pg (ref 26.0–34.0)
MCHC: 32.7 g/dL (ref 30.0–36.0)
MCV: 95.9 fL (ref 80.0–100.0)
Platelets: 236 10*3/uL (ref 150–400)
RBC: 3.92 MIL/uL (ref 3.87–5.11)
RDW: 13.4 % (ref 11.5–15.5)
WBC: 9.5 10*3/uL (ref 4.0–10.5)
nRBC: 0 % (ref 0.0–0.2)

## 2020-07-11 NOTE — TOC Progression Note (Signed)
Transition of Care [facility]) - Progression Note    Patient Details  Name: Jaime Solomon MRN: [id number] Date of Birth: Aug 02, 1961  [facility]) CM/SW [place], RN Phone Number: [phone]  07/11/2020, 11:15 AM  Clinical Narrative:    CM received message from nurse stating that patient needs home health. Home health has been previously set up with [facility]. CM has verified with Helene Kelp at [facility]. TOC will sign off.         Expected Discharge Plan and Services           Expected Discharge Date: 07/11/20                                     Social Determinants of Health (SDOH) Interventions    Readmission Risk Interventions No flowsheet data found.

## 2020-07-11 NOTE — Progress Notes (Signed)
Subjective: 1 Day Post-Op Procedure(s) (LRB): RIGHT TOTAL KNEE ARTHROPLASTY (Right) Patient reports pain as moderate.    Objective: Vital signs in last 24 hours: Temp:  [97.5 F (36.4 C)-98.2 F (36.8 C)] 97.5 F (36.4 C) (11/09 0742) Pulse Rate:  [81-99] 93 (11/09 0742) Resp:  [13-20] 17 (11/09 0742) BP: (105-143)/(57-83) 138/74 (11/09 0742) SpO2:  [91 %-98 %] 93 % (11/09 0742)  Intake/Output from previous day: 11/08 0701 - 11/09 0700 In: 1800 [I.V.:1500; IV Piggyback:300] Out: 925 [Urine:825; Blood:100] Intake/Output this shift: No intake/output data recorded.  Recent Labs    07/11/20 0322  HGB 12.3   Recent Labs    07/11/20 0322  WBC 9.5  RBC 3.92  HCT 37.6  PLT 236   Recent Labs    07/11/20 0322  NA 139  K 4.4  CL 101  CO2 28  BUN 6*  CREATININE 0.86  GLUCOSE 128*  CALCIUM 9.7   No results for input(s): LABPT, INR in the last 72 hours.  Neurologically intact Neurovascular intact Sensation intact distally Intact pulses distally Dorsiflexion/Plantar flexion intact Incision: scant drainage No cellulitis present Compartment soft     Assessment/Plan: 1 Day Post-Op Procedure(s) (LRB): RIGHT TOTAL KNEE ARTHROPLASTY (Right) Up with therapy D/C IV fluids Discharge home with home health only if she progresses with PT.  May change d/c order to tomorrow if needed.  She only has two steps to get into the house, but notes that she lives alone. WBAT RLE D/c foley D/c rx called in last week   Anticipated LOS equal to or greater than 2 midnights due to - Age 9 and older with one or more of the following:  - Obesity  - Expected need for hospital services (PT, OT, Nursing) required for safe  discharge  - Anticipated need for postoperative skilled nursing care or inpatient rehab  - Active co-morbidities: None OR   - Unanticipated findings during/Post Surgery: Slow post-op progression: GI, pain control, mobility  - Patient is a high risk of  re-admission due to: Barriers to post-acute care (logistical, no family support in home)    Aundra Dubin 07/11/2020, 7:59 AM

## 2020-07-11 NOTE — Progress Notes (Signed)
Physical Therapy Treatment Patient Details Name: Jaime Solomon MRN: [id number] DOB: 02-Oct-1960 Today's Date: 07/11/2020    History of Present Illness Pt is a 59 y/o female s/p R TKA 07/10/20. PMH includes breast cancer, cerebral aneurysm, back surgery.   PT Comments    Pt progressing well with mobility. Session focused on continued gait training, as well as LE therex/ROM (HEP handout provided); pt declined additional stair training. Preparing for d/c home this afternoon. Pt reports no further questions or concerns. Continue to recommend follow-up with HHPT services to maximize functional mobility and independence.    Follow Up Recommendations  Follow surgeon's recommendation for DC plan and follow-up therapies;Home health PT;Supervision for mobility/OOB     Equipment Recommendations  None recommended by PT    Recommendations for Other Services       Precautions / Restrictions Precautions Precautions: Knee Restrictions Weight Bearing Restrictions: Yes RLE Weight Bearing: Weight bearing as tolerated    Mobility  Bed Mobility               General bed mobility comments: Received sitting EOB  Transfers Overall transfer level: Needs assistance Equipment used: None;Rolling walker (2 wheeled) Transfers: Sit to/from Stand Sit to Stand: Supervision         General transfer comment: Standing with and without RW from EOB; cues for hand placement with RW  Ambulation/Gait Ambulation/Gait assistance: Min guard;Supervision Gait Distance (Feet): 340 Feet Assistive device: Rolling walker (2 wheeled) Gait Pattern/deviations: Step-through pattern;Decreased stride length;Antalgic Gait velocity: Decreased   General Gait Details: Slow, antalgic gait with RW and intermittent min guard for balance; stability and gait mechanics improving. Cues to keep both hands on RW if moving, as pt frequently takes UE support off while talking   Stairs Stairs:  (pt declined additional stair  training)           Wheelchair Mobility    Modified Rankin (Stroke Patients Only)       Balance Overall balance assessment: Needs assistance Sitting-balance support: No upper extremity supported;Feet supported Sitting balance-Leahy Scale: Good     Standing balance support: Bilateral upper extremity supported;During functional activity;No upper extremity supported Standing balance-Leahy Scale: Fair Standing balance comment: Can static stand without UE support; stability improved with RW                            Cognition Arousal/Alertness: Awake/alert Behavior During Therapy: WFL for tasks assessed/performed Overall Cognitive Status: No family/caregiver present to determine baseline cognitive functioning Area of Impairment: Attention;Memory                   Current Attention Level: Selective Memory: Decreased short-term memory         General Comments: Pt self-reports "poor short-term memory at baseline." Internally distracted and repetitive, at times contradicting herself. Frequent cues to slow down, catch breath and redirect to task at hand      Exercises Total Joint Exercises Ankle Circles/Pumps: AROM;Both;Seated Long Arc Quad: AROM;Both;Seated Marching in Standing: AROM;Both;Seated General Exercises - Lower Extremity Toe Raises: AROM;Both;Seated Heel Raises: AROM;Both;Seated Other Exercises Other Exercises: [employer] HEP handout (Access Code [id number]) provided and practiced    General Comments        Pertinent Vitals/Pain Pain Assessment: Faces Faces Pain Scale: Hurts a little bit Pain Location: R knee  Pain Descriptors / Indicators: Operative site guarding;Sore Pain Intervention(s): Monitored during session    Home Living  Prior Function            PT Goals (current goals can now be found in the care plan section) Progress towards PT goals: Progressing toward goals    Frequency     7X/week      PT Plan Current plan remains appropriate    Co-evaluation              AM-PAC PT "6 Clicks" Mobility   Outcome Measure  Help needed turning from your back to your side while in a flat bed without using bedrails?: None Help needed moving from lying on your back to sitting on the side of a flat bed without using bedrails?: None Help needed moving to and from a bed to a chair (including a wheelchair)?: None Help needed standing up from a chair using your arms (e.g., wheelchair or bedside chair)?: None Help needed to walk in hospital room?: A Little Help needed climbing 3-5 steps with a railing? : A Lot 6 Click Score: 21    End of Session Equipment Utilized During Treatment: Gait belt Activity Tolerance: Patient tolerated treatment well Patient left: with call bell/phone within reach;in bed Nurse Communication: Mobility status PT Visit Diagnosis: Muscle weakness (generalized) (M62.81);Pain;Other abnormalities of gait and mobility (R26.89) Pain - Right/Left: Right Pain - part of body: Knee     Time: 4709-2957 PT Time Calculation (min) (ACUTE ONLY): 16 min  Charges:  $Therapeutic Exercise: 8-22 mins                     [name], PT, DPT Acute Rehabilitation Services  Pager [phone] Office [place] 07/11/2020, 2:29 PM

## 2020-07-11 NOTE — Progress Notes (Signed)
Physical Therapy Treatment Patient Details Name: Jaime Solomon MRN: [id number] DOB: April 06, 1961 Today's Date: 07/11/2020    History of Present Illness Pt is a 59 y/o female s/p R TKA 07/10/20. PMH includes breast cancer, cerebral aneurysm, back surgery.   PT Comments    Pt progressing with mobility. This morning's session focused on transfer, gait and stair training; pt requiring modA for stability on stairs without rail support. Will plan for additional session for continued stair training and therex education prior to d/c home this afternoon. Pt will be home alone; reports having good friend support nearby if needed. Educ re: safety recommendations and fall risk reduction, precautions, positioning and activity recommendations.   Follow Up Recommendations  Follow surgeon's recommendation for DC plan and follow-up therapies;Home health PT;Supervision for mobility/OOB     Equipment Recommendations  None recommended by PT    Recommendations for Other Services       Precautions / Restrictions Precautions Precautions: Knee Restrictions Weight Bearing Restrictions: Yes RLE Weight Bearing: Weight bearing as tolerated    Mobility  Bed Mobility Overal bed mobility: Modified Independent Bed Mobility: Supine to Sit     Supine to sit: Modified independent (Device/Increase time);HOB elevated        Transfers Overall transfer level: Needs assistance Equipment used: None;Rolling walker (2 wheeled) Transfers: Sit to/from Stand Sit to Stand: Supervision         General transfer comment: Standing with and without RW from EOB; cues for hand placement with RW  Ambulation/Gait Ambulation/Gait assistance: Min guard;Supervision Gait Distance (Feet): 300 Feet Assistive device: Rolling walker (2 wheeled) Gait Pattern/deviations: Step-through pattern;Decreased stride length;Antalgic Gait velocity: Decreased   General Gait Details: Slow, antalgic gait with RW and intermittent min guard  for balance; cues for increased step length and heel-to-toe gait pattern. Pt talking with hands and frequently taking UE support off RW, cues to stop walking if doing this, able to self-correct with intermittent cues for safety   Stairs Stairs: Yes Stairs assistance: Min guard;Mod assist Stair Management: One rail Right;No rails;Forwards;Step to pattern Number of Stairs: 4 General stair comments: Ascend/descend steps with RUE rail support and min guard; additional trial without rail support, reliant on modA for HHA to maintain balance; cues for sequencing   Wheelchair Mobility    Modified Rankin (Stroke Patients Only)       Balance Overall balance assessment: Needs assistance Sitting-balance support: No upper extremity supported;Feet supported Sitting balance-Leahy Scale: Good     Standing balance support: Bilateral upper extremity supported;During functional activity;No upper extremity supported Standing balance-Leahy Scale: Fair Standing balance comment: Can static stand without UE support; stability improved with RW                            Cognition Arousal/Alertness: Awake/alert Behavior During Therapy: WFL for tasks assessed/performed Overall Cognitive Status: No family/caregiver present to determine baseline cognitive functioning Area of Impairment: Attention;Memory                   Current Attention Level: Selective Memory: Decreased short-term memory         General Comments: Pt self-reports feeling "loopy" and "poor short-term memory at baseline." Internally distracted and repetitive, at times contradicting herself. Frequent cues to slow down, catch breath and redirect to task at hand      Exercises      General Comments General comments (skin integrity, edema, etc.): Discussed home set-up and need for shower seat, but  pt unsure what will fit - determined pt will wait for HHPT to assess safety with showering, and pt to perform bird bath  at sink in mean time to reduce fall risk      Pertinent Vitals/Pain Pain Assessment: Faces Faces Pain Scale: Hurts little more Pain Location: R knee  Pain Descriptors / Indicators: Aching;Operative site guarding Pain Intervention(s): Monitored during session    Home Living                      Prior Function            PT Goals (current goals can now be found in the care plan section) Progress towards PT goals: Progressing toward goals    Frequency    7X/week      PT Plan Current plan remains appropriate    Co-evaluation              AM-PAC PT "6 Clicks" Mobility   Outcome Measure  Help needed turning from your back to your side while in a flat bed without using bedrails?: None Help needed moving from lying on your back to sitting on the side of a flat bed without using bedrails?: None Help needed moving to and from a bed to a chair (including a wheelchair)?: A Little Help needed standing up from a chair using your arms (e.g., wheelchair or bedside chair)?: A Little Help needed to walk in hospital room?: A Little Help needed climbing 3-5 steps with a railing? : A Lot 6 Click Score: 19    End of Session Equipment Utilized During Treatment: Gait belt Activity Tolerance: Patient tolerated treatment well Patient left: with call bell/phone within reach;in bed Nurse Communication: Mobility status PT Visit Diagnosis: Muscle weakness (generalized) (M62.81);Pain;Other abnormalities of gait and mobility (R26.89) Pain - Right/Left: Right Pain - part of body: Knee     Time: 0811-0829 PT Time Calculation (min) (ACUTE ONLY): 18 min  Charges:  $Gait Training: 8-22 mins                    [name], PT, DPT Acute Rehabilitation Services  Pager [phone] Office [place] 07/11/2020, 9:28 AM

## 2020-07-11 NOTE — Discharge Summary (Signed)
Patient ID: Jaime Solomon MRN: [id number] DOB/AGE: 1961-01-29 59 y.o.  Admit date: 07/10/2020 Discharge date: 07/11/2020  Admission Diagnoses:  Principal Problem:   Primary osteoarthritis of right knee Active Problems:   Status post total knee replacement, right   Discharge Diagnoses:  Same  Past Medical History:  Diagnosis Date  . Anxiety   . Breast cancer ([place]) 2006  . Breast cancer ([place]) 05/22/2012  . Cerebral aneurysm without rupture 2010  . GERD (gastroesophageal reflux disease)   . Headache   . Personal history of radiation therapy     Surgeries: Procedure(s): RIGHT TOTAL KNEE ARTHROPLASTY on 07/10/2020   Consultants:   Discharged Condition: Improved  Hospital Course: Jaime Solomon is an 59 y.o. female who was admitted 07/10/2020 for operative treatment ofPrimary osteoarthritis of right knee. Patient has severe unremitting pain that affects sleep, daily activities, and work/hobbies. After pre-op clearance the patient was taken to the operating room on 07/10/2020 and underwent  Procedure(s): RIGHT TOTAL KNEE ARTHROPLASTY.    Patient was given perioperative antibiotics:  Anti-infectives (From admission, onward)   Start     Dose/Rate Route Frequency Ordered Stop   07/10/20 1330  ceFAZolin (ANCEF) IVPB 2g/100 mL premix        2 g 200 mL/hr over 30 Minutes Intravenous Every 6 hours 07/10/20 1052 07/10/20 1909   07/10/20 0814  vancomycin (VANCOCIN) powder  Status:  Discontinued          As needed 07/10/20 0815 07/10/20 0943   07/10/20 0600  ceFAZolin (ANCEF) IVPB 2g/100 mL premix        2 g 200 mL/hr over 30 Minutes Intravenous On call to O.R. 07/10/20 0542 07/10/20 0730       Patient was given sequential compression devices, early ambulation, and chemoprophylaxis to prevent DVT.  Patient benefited maximally from hospital stay and there were no complications.    Recent vital signs:  Patient Vitals for the past 24 hrs:  BP Temp Temp src Pulse Resp SpO2  07/11/20  0742 138/74 (!) 97.5 F (36.4 C) Oral 93 17 93 %  07/11/20 0454 120/78 97.9 F (36.6 C) Oral 84 20 91 %  07/10/20 2316 (!) 143/83 98 F (36.7 C) Oral 94 18 96 %  07/10/20 1915 134/73 97.9 F (36.6 C) Oral 99 18 91 %  07/10/20 1548 123/75 98 F (36.7 C) Oral 96 16 95 %  07/10/20 1059 114/66 98.2 F (36.8 C) Oral 84 16 97 %  07/10/20 1030 -- -- -- 81 14 93 %  07/10/20 1020 115/65 97.9 F (36.6 C) -- 81 16 95 %  07/10/20 1015 -- -- -- 81 14 94 %  07/10/20 1005 110/68 -- -- 83 13 98 %  07/10/20 1000 -- -- -- 82 15 97 %  07/10/20 0950 (!) 105/57 97.9 F (36.6 C) -- 82 20 97 %     Recent laboratory studies:  Recent Labs    07/11/20 0322  WBC 9.5  HGB 12.3  HCT 37.6  PLT 236  NA 139  K 4.4  CL 101  CO2 28  BUN 6*  CREATININE 0.86  GLUCOSE 128*  CALCIUM 9.7     Discharge Medications:   Allergies as of 07/11/2020      Reactions   Depakote [divalproex Sodium] Other (See Comments)   hallucinations   Topamax [topiramate] Other (See Comments)   "feel like I'm in a fog, cant talk"      Medication List    STOP taking  these medications   acetaminophen-codeine 300-30 MG tablet Commonly known as: TYLENOL #3   ibuprofen 800 MG tablet Commonly known as: ADVIL   ketorolac 10 MG tablet Commonly known as: TORADOL   predniSONE 50 MG tablet Commonly known as: DELTASONE     TAKE these medications   albuterol 108 (90 Base) MCG/ACT inhaler Commonly known as: VENTOLIN HFA Inhale 2 puffs into the lungs every 4 (four) hours as needed for wheezing or shortness of breath (or coughing).   ALPRAZolam 0.5 MG tablet Commonly known as: XANAX Take 0.5 mg by mouth at bedtime as needed for sleep.   aspirin EC 81 MG tablet Take 1 tablet (81 mg total) by mouth 2 (two) times daily.   buPROPion 300 MG 24 hr tablet Commonly known as: WELLBUTRIN XL Take 300 mg by mouth daily.   calcium-vitamin D 500-200 MG-UNIT tablet Commonly known as: OSCAL WITH D Take 1 tablet by mouth 3  (three) times daily.   citalopram 40 MG tablet Commonly known as: CELEXA Take 40 mg by mouth daily.   diphenhydrAMINE 25 MG tablet Commonly known as: SOMINEX Take 50 mg by mouth at bedtime as needed for sleep.   docusate sodium 100 MG capsule Commonly known as: Colace Take 1 capsule (100 mg total) by mouth daily as needed.   gabapentin 300 MG capsule Commonly known as: NEURONTIN Take 300 mg by mouth 2 (two) times daily.   Lumify 0.025 % Soln Generic drug: Brimonidine Tartrate Place 1 drop into both eyes daily as needed (redness).   methocarbamol 500 MG tablet Commonly known as: Robaxin Take 1 tablet (500 mg total) by mouth 2 (two) times daily as needed.   multivitamin with minerals Tabs tablet Take 1 tablet by mouth daily.   nortriptyline 75 MG capsule Commonly known as: PAMELOR Take 150 mg by mouth at bedtime.   ondansetron 4 MG tablet Commonly known as: Zofran Take 1 tablet (4 mg total) by mouth every 8 (eight) hours as needed for nausea or vomiting.   oxyCODONE-acetaminophen 5-325 MG tablet Commonly known as: Percocet Take 1-2 tablets by mouth every 6 (six) hours as needed.   pantoprazole 40 MG tablet Commonly known as: PROTONIX Take 40 mg by mouth 2 (two) times daily.   rizatriptan 10 MG tablet Commonly known as: MAXALT Take 10 mg by mouth as needed for migraine. May repeat in 2 hours if needed   zinc sulfate 220 (50 Zn) MG capsule Take 1 capsule (220 mg total) by mouth daily.            Durable Medical Equipment  (From admission, onward)         Start     Ordered   07/10/20 1053  DME Walker rolling  Once       Question:  Patient needs a walker to treat with the following condition  Answer:  Total knee replacement status   07/10/20 1052   07/10/20 1053  DME 3 n 1  Once        07/10/20 1052   07/10/20 1053  DME Bedside commode  Once       Question:  Patient needs a bedside commode to treat with the following condition  Answer:  Total knee  replacement status   07/10/20 1052          Diagnostic Studies: DG Chest 2 View  Result Date: 07/07/2020 CLINICAL DATA:  Preoperative evaluation for right knee osteoarthritis. EXAM: CHEST - 2 VIEW COMPARISON:  04/27/2020 chest radiograph. FINDINGS:  Stable cardiomediastinal silhouette with normal heart size. No pneumothorax. No pleural effusion. Lungs appear clear, with no acute consolidative airspace disease and no pulmonary edema. Partially visualized fixation hardware in a humeral shaft on the lateral view. Partially visualized bilateral posterior lumbar spinal fusion hardware. Cholecystectomy clips are seen in the right upper quadrant of the abdomen. IMPRESSION: No active cardiopulmonary disease. Electronically Signed   By: Ilona Sorrel M.D.   On: 07/07/2020 16:29   DG Knee Right Port  Result Date: 07/10/2020 CLINICAL DATA:  Right total knee replacement. EXAM: PORTABLE RIGHT KNEE - 1-2 VIEW COMPARISON:  Standing knee radiographs 05/23/2020 FINDINGS: Patient is status post right total knee arthroplasty. Fluid gas are present in the joint. Components are well seated. The knee is located. IMPRESSION: Status post right total knee arthroplasty without radiographic evidence for complication. Electronically Signed   By: San Morelle M.D.   On: 07/10/2020 11:06    Disposition: Discharge disposition: 01-Home or Self Care          Follow-up Information    Leandrew Koyanagi, MD. Schedule an appointment as soon as possible for a visit in 2 week(s).   Specialty: Orthopedic Surgery Contact information: [address] [place] [address] [phone]                Signed: Aundra Dubin 07/11/2020, 8:01 AM

## 2020-07-12 ENCOUNTER — Inpatient Hospital Stay: Payer: Medicare Other | Admitting: Orthopaedic Surgery

## 2020-07-12 ENCOUNTER — Telehealth: Payer: Self-pay

## 2020-07-12 NOTE — Telephone Encounter (Signed)
Pt informed and stated understanding. Pt is calling medical transport to see if she can come in tomorrow morning. She will call us back to let us know.

## 2020-07-12 NOTE — Telephone Encounter (Signed)
She should apply compressive dressing.  Thans.

## 2020-07-12 NOTE — Telephone Encounter (Signed)
Pt called and stated she doesn't have a ride to be able to come in today .Pt doesn't have any signs of infection. Complaint was excessive bleeding   She wanted to know if she can just change her bandage herself.  Please advise what pt should do.

## 2020-07-12 NOTE — Telephone Encounter (Signed)
Pt is scheduled for tomorrow 

## 2020-07-13 ENCOUNTER — Encounter: Payer: Self-pay | Admitting: Orthopaedic Surgery

## 2020-07-13 ENCOUNTER — Other Ambulatory Visit: Payer: Self-pay

## 2020-07-13 ENCOUNTER — Ambulatory Visit (INDEPENDENT_AMBULATORY_CARE_PROVIDER_SITE_OTHER): Payer: Medicare Other | Admitting: Orthopaedic Surgery

## 2020-07-13 DIAGNOSIS — M1711 Unilateral primary osteoarthritis, right knee: Secondary | ICD-10-CM

## 2020-07-13 MED ORDER — CEPHALEXIN 500 MG PO CAPS: 500.0000 mg | ORAL_CAPSULE | Freq: Four times a day (QID) | ORAL | 0 refills | Status: AC

## 2020-07-13 MED ORDER — HYDROCODONE-ACETAMINOPHEN 7.5-325 MG PO TABS
1.0000 | ORAL_TABLET | Freq: Three times a day (TID) | ORAL | 0 refills | Status: DC | PRN
Start: 2020-07-13 — End: 2020-07-21

## 2020-07-13 MED ORDER — KETOROLAC TROMETHAMINE 10 MG PO TABS: 10.0000 mg | ORAL_TABLET | Freq: Two times a day (BID) | ORAL | 0 refills | Status: AC | PRN

## 2020-07-13 NOTE — Progress Notes (Signed)
Post-Op Visit Note   Patient: Jaime Solomon           Date of Birth: 08/27/1961           MRN: [id number] Visit Date: 07/13/2020 PCP: Shirline Frees, MD   Assessment & Plan:  Chief Complaint:  Chief Complaint  Patient presents with  . Right Knee - Pain   Visit Diagnoses:  1. Primary osteoarthritis of right knee     Plan: Latiya is 3 days status post right total knee replacement. She is concerned about the pain and swelling and drainage on her surgical dressing. Denies any constitutional symptoms or fever and chills. She is having some trouble at home as she has limited resources for assistance. She does not feel unsafe at home. She feels tightness in the calf. She ripped her TED hoses because of her nails.  Surgical bandage was removed today. She has an intact incision without any signs of infection. She has a scant amount of serous drainage from the corners of the incision. She does have swelling of the entire extremity that is typical changes. No calf tenderness.  The incision was painted with Betadine and a brand-new Mepilex dressing was placed in the office. I then placed a new TED hose and showed her how to do this effectively at home. I also wrapped her knee and thigh above the TED hose with an Ace bandage today. She is to keep this on during the day and can take off at nighttime. I sent in a prescription for Toradol to help with inflammation and pain as well as hydrocodone and Keflex. She will start home health PT tomorrow and continue with the CPM. She will call with any questions or concerns. We will see her as scheduled for her postoperative visit.  Follow-Up Instructions: Return for as scheduled for 2 week postop visit.   Orders:  No orders of the defined types were placed in this encounter.  Meds ordered this encounter  Medications  . ketorolac (TORADOL) 10 MG tablet    Sig: Take 1 tablet (10 mg total) by mouth 2 (two) times daily as needed.    Dispense:  10 tablet     Refill:  0  . HYDROcodone-acetaminophen (NORCO) 7.5-325 MG tablet    Sig: Take 1-2 tablets by mouth 3 (three) times daily as needed for moderate pain.    Dispense:  30 tablet    Refill:  0  . cephALEXin (KEFLEX) 500 MG capsule    Sig: Take 1 capsule (500 mg total) by mouth 4 (four) times daily.    Dispense:  40 capsule    Refill:  0    Imaging: No results found.  PMFS History: Patient Active Problem List   Diagnosis Date Noted  . Primary osteoarthritis of right knee 07/10/2020  . Status post total knee replacement, right 07/10/2020  . Breast cancer ([facility]) 05/22/2005   Past Medical History:  Diagnosis Date  . Anxiety   . Breast cancer ([facility]) 2006  . Breast cancer ([facility]) 05/22/2012  . Cerebral aneurysm without rupture 2010  . GERD (gastroesophageal reflux disease)   . Headache   . Personal history of radiation therapy     Family History  Problem Relation Age of Onset  . Diabetes Father   . Hypertension Father     Past Surgical History:  Procedure Laterality Date  . ANEURYSM COILING  2010   coiling and stenting for cerebral aneurysm  . angiography  2010   head  .  BACK SURGERY  2020   L3-S1 Laminectomy and fusion - plate and 8 screws  . BREAST LUMPECTOMY Right 2006  . CHOLECYSTECTOMY    . MASTECTOMY PARTIAL / LUMPECTOMY  2006   right   . ORIF HUMERUS FRACTURE Left 06/21/2019   Procedure: OPEN REDUCTION INTERNAL FIXATION (ORIF) LEFT HUMERAL SHAFT FRACTURE;  Surgeon: Leandrew Koyanagi, MD;  Location: [facility];  Service: Orthopedics;  Laterality: Left;  . PARTIAL HYSTERECTOMY  1991  . ROTATOR CUFF REPAIR Right   . TOTAL KNEE ARTHROPLASTY Right 07/10/2020   Procedure: RIGHT TOTAL KNEE ARTHROPLASTY;  Surgeon: Leandrew Koyanagi, MD;  Location: [facility];  Service: Orthopedics;  Laterality: Right;   Social History   Occupational History  . Not on file  Tobacco Use  . Smoking status: Former Smoker    Quit date: 05/22/2005    Years since quitting: 15.1  . Smokeless tobacco: Never Used   Vaping Use  . Vaping Use: Never used  Substance and Sexual Activity  . Alcohol use: Yes    Alcohol/week: 2.0 standard drinks    Types: 2 Glasses of wine per week  . Drug use: No  . Sexual activity: Not Currently

## 2020-07-17 ENCOUNTER — Other Ambulatory Visit: Payer: Self-pay | Admitting: Orthopaedic Surgery

## 2020-07-18 NOTE — Telephone Encounter (Signed)
Cannot refill this. Not good for kidneys to take more than one week

## 2020-07-21 ENCOUNTER — Other Ambulatory Visit: Payer: Self-pay | Admitting: Orthopaedic Surgery

## 2020-07-21 MED ORDER — HYDROCODONE-ACETAMINOPHEN 7.5-325 MG PO TABS
1.0000 | ORAL_TABLET | Freq: Three times a day (TID) | ORAL | 0 refills | Status: DC | PRN
Start: 2020-07-21 — End: 2020-07-25

## 2020-07-21 NOTE — Telephone Encounter (Signed)
Xu patient

## 2020-07-25 ENCOUNTER — Other Ambulatory Visit: Payer: Self-pay

## 2020-07-25 ENCOUNTER — Ambulatory Visit (INDEPENDENT_AMBULATORY_CARE_PROVIDER_SITE_OTHER): Payer: Medicare Other | Admitting: Physician Assistant

## 2020-07-25 DIAGNOSIS — Z96651 Presence of right artificial knee joint: Secondary | ICD-10-CM

## 2020-07-25 MED ORDER — HYDROCODONE-ACETAMINOPHEN 5-325 MG PO TABS
1.0000 | ORAL_TABLET | Freq: Three times a day (TID) | ORAL | 0 refills | Status: DC | PRN
Start: 2020-07-25 — End: 2020-08-22

## 2020-07-25 NOTE — Progress Notes (Signed)
Post-Op Visit Note   Patient: Jaime Solomon           Date of Birth: [birth year]           MRN: [id number] Visit Date: 07/25/2020 PCP: Shirline Frees, MD   Assessment & Plan:  Chief Complaint: No chief complaint on file.  Visit Diagnoses:  1. Hx of total knee replacement, right     Plan: Patient is a pleasant 59 year old female who comes in today 2 weeks out right total knee replacement.  She has been doing well.  She has been in home health physical therapy making great progress.  She is ambulating with a cane.  She has been discharged tomorrow from home health therapy.  Examination of her right knee reveals a fully healed surgical scar without complication.  Calf soft nontender.  She is neurovascular intact distally.  At this point, she will continue to advance with activity as tolerated.  Of gone ahead and put in an internal referral for outpatient physical therapy which she notes she can start next week.  I have refilled her Norco so she will not run out over the holiday.  Dental prophylaxis reinforced.  Follow-up with [place] in 4 weeks time for repeat evaluation and 2 view x-rays of the right knee.  Follow-Up Instructions: Return in about 4 weeks (around 08/22/2020).   Orders:  Orders Placed This Encounter  Procedures  . Ambulatory referral to Physical Therapy   Meds ordered this encounter  Medications  . HYDROcodone-acetaminophen (NORCO) 5-325 MG tablet    Sig: Take 1-2 tablets by mouth 3 (three) times daily as needed.    Dispense:  30 tablet    Refill:  0    Imaging: No new imaging  PMFS History: Patient Active Problem List   Diagnosis Date Noted  . Primary osteoarthritis of right knee 07/10/2020  . Status post total knee replacement, right 07/10/2020  . Breast cancer ([facility]) 05/22/2005   Past Medical History:  Diagnosis Date  . Anxiety   . Breast cancer ([facility]) 2006  . Breast cancer ([facility]) 05/22/2012  . Cerebral aneurysm without rupture 2010  . GERD (gastroesophageal  reflux disease)   . Headache   . Personal history of radiation therapy     Family History  Problem Relation Age of Onset  . Diabetes Father   . Hypertension Father     Past Surgical History:  Procedure Laterality Date  . ANEURYSM COILING  2010   coiling and stenting for cerebral aneurysm  . angiography  2010   head  . BACK SURGERY  2020   L3-S1 Laminectomy and fusion - plate and 8 screws  . BREAST LUMPECTOMY Right 2006  . CHOLECYSTECTOMY    . MASTECTOMY PARTIAL / LUMPECTOMY  2006   right   . ORIF HUMERUS FRACTURE Left 06/21/2019   Procedure: OPEN REDUCTION INTERNAL FIXATION (ORIF) LEFT HUMERAL SHAFT FRACTURE;  Surgeon: Leandrew Koyanagi, MD;  Location: [facility];  Service: Orthopedics;  Laterality: Left;  . PARTIAL HYSTERECTOMY  1991  . ROTATOR CUFF REPAIR Right   . TOTAL KNEE ARTHROPLASTY Right 07/10/2020   Procedure: RIGHT TOTAL KNEE ARTHROPLASTY;  Surgeon: Leandrew Koyanagi, MD;  Location: [facility];  Service: Orthopedics;  Laterality: Right;   Social History   Occupational History  . Not on file  Tobacco Use  . Smoking status: Former Smoker    Quit date: 05/22/2005    Years since quitting: 15.1  . Smokeless tobacco: Never Used  Vaping Use  .  Vaping Use: Never used  Substance and Sexual Activity  . Alcohol use: Yes    Alcohol/week: 2.0 standard drinks    Types: 2 Glasses of wine per week  . Drug use: No  . Sexual activity: Not Currently

## 2020-08-01 ENCOUNTER — Other Ambulatory Visit: Payer: Self-pay | Admitting: Physician Assistant

## 2020-08-06 ENCOUNTER — Other Ambulatory Visit: Payer: Self-pay | Admitting: Physician Assistant

## 2020-08-22 ENCOUNTER — Ambulatory Visit (INDEPENDENT_AMBULATORY_CARE_PROVIDER_SITE_OTHER): Payer: Medicare Other | Admitting: Orthopaedic Surgery

## 2020-08-22 ENCOUNTER — Other Ambulatory Visit: Payer: Self-pay

## 2020-08-22 ENCOUNTER — Other Ambulatory Visit: Payer: Self-pay | Admitting: Physician Assistant

## 2020-08-22 ENCOUNTER — Encounter: Payer: Self-pay | Admitting: Orthopaedic Surgery

## 2020-08-22 ENCOUNTER — Ambulatory Visit (INDEPENDENT_AMBULATORY_CARE_PROVIDER_SITE_OTHER): Payer: Medicare Other

## 2020-08-22 VITALS — Ht 64.0 in | Wt 229.0 lb

## 2020-08-22 DIAGNOSIS — Z96651 Presence of right artificial knee joint: Secondary | ICD-10-CM | POA: Diagnosis not present

## 2020-08-22 MED ORDER — HYDROCODONE-ACETAMINOPHEN 5-325 MG PO TABS
1.0000 | ORAL_TABLET | Freq: Two times a day (BID) | ORAL | 0 refills | Status: AC | PRN
Start: 2020-08-22 — End: ?

## 2020-08-22 NOTE — Progress Notes (Signed)
Post-Op Visit Note   Patient: Jaime Solomon           Date of Birth: [birth year]           MRN: [id number] Visit Date: 08/22/2020 PCP: Shirline Frees, MD   Assessment & Plan:  Chief Complaint:  Chief Complaint  Patient presents with  . Right Knee - Follow-up    Right TKA 07/10/2020   Visit Diagnoses:  1. Hx of total knee replacement, right     Plan:   Myrth is 6 weeks status post right total knee replacement.  Doing well overall.  She completed home health PT and decided that she did not need to go to outpatient PT.  She has no complaints other than some occasional stabbing pains.  She is very happy with her outcome so far.  She is adept with all ADLs at this point.  Surgical scar is fully healed.  Excellent range of motion to about 120 degrees of flexion.  No signs of infection.  X-rays demonstrate stable total knee replacement without complications.  From my standpoint she is doing great.  She can continue to increase activity as tolerated.  Dental prophylaxis reinforced.  Discontinue aspirin.  Recheck in 6 weeks for 67-month visit with two-view x-rays of the right knee.  Follow-Up Instructions: Return in about 6 weeks (around 10/03/2020).   Orders:  Orders Placed This Encounter  Procedures  . XR Knee 1-2 Views Right   No orders of the defined types were placed in this encounter.   Imaging: XR Knee 1-2 Views Right  Result Date: 08/22/2020 Well-seated prosthesis without complication   PMFS History: Patient Active Problem List   Diagnosis Date Noted  . Primary osteoarthritis of right knee 07/10/2020  . Status post total knee replacement, right 07/10/2020  . Breast cancer ([facility]) 05/22/2005   Past Medical History:  Diagnosis Date  . Anxiety   . Breast cancer ([facility]) 2006  . Breast cancer ([facility]) 05/22/2012  . Cerebral aneurysm without rupture 2010  . GERD (gastroesophageal reflux disease)   . Headache   . Personal history of radiation therapy     Family History   Problem Relation Age of Onset  . Diabetes Father   . Hypertension Father     Past Surgical History:  Procedure Laterality Date  . ANEURYSM COILING  2010   coiling and stenting for cerebral aneurysm  . angiography  2010   head  . BACK SURGERY  2020   L3-S1 Laminectomy and fusion - plate and 8 screws  . BREAST LUMPECTOMY Right 2006  . CHOLECYSTECTOMY    . MASTECTOMY PARTIAL / LUMPECTOMY  2006   right   . ORIF HUMERUS FRACTURE Left 06/21/2019   Procedure: OPEN REDUCTION INTERNAL FIXATION (ORIF) LEFT HUMERAL SHAFT FRACTURE;  Surgeon: Leandrew Koyanagi, MD;  Location: [facility];  Service: Orthopedics;  Laterality: Left;  . PARTIAL HYSTERECTOMY  1991  . ROTATOR CUFF REPAIR Right   . TOTAL KNEE ARTHROPLASTY Right 07/10/2020   Procedure: RIGHT TOTAL KNEE ARTHROPLASTY;  Surgeon: Leandrew Koyanagi, MD;  Location: [facility];  Service: Orthopedics;  Laterality: Right;   Social History   Occupational History  . Not on file  Tobacco Use  . Smoking status: Former Smoker    Quit date: 05/22/2005    Years since quitting: 15.2  . Smokeless tobacco: Never Used  Vaping Use  . Vaping Use: Never used  Substance and Sexual Activity  . Alcohol use: Yes    Alcohol/week:  2.0 standard drinks    Types: 2 Glasses of wine per week  . Drug use: No  . Sexual activity: Not Currently

## 2020-08-22 NOTE — Telephone Encounter (Signed)
I am happy to send in

## 2020-08-29 ENCOUNTER — Ambulatory Visit: Payer: Medicare Other

## 2020-09-13 DIAGNOSIS — K219 Gastro-esophageal reflux disease without esophagitis: Secondary | ICD-10-CM | POA: Diagnosis not present

## 2020-09-13 DIAGNOSIS — G8929 Other chronic pain: Secondary | ICD-10-CM | POA: Diagnosis not present

## 2020-09-13 DIAGNOSIS — Z853 Personal history of malignant neoplasm of breast: Secondary | ICD-10-CM | POA: Diagnosis not present

## 2020-09-27 ENCOUNTER — Ambulatory Visit
Admission: RE | Admit: 2020-09-27 | Discharge: 2020-09-27 | Disposition: A | Discharge status: Home / Self Care | Payer: Medicare Other | Source: Ambulatory Visit | Attending: Family Medicine | Admitting: Family Medicine

## 2020-09-27 ENCOUNTER — Other Ambulatory Visit: Payer: Self-pay

## 2020-09-27 DIAGNOSIS — Z1231 Encounter for screening mammogram for malignant neoplasm of breast: Secondary | ICD-10-CM

## 2020-10-11 ENCOUNTER — Ambulatory Visit: Payer: Medicare Other | Admitting: Orthopaedic Surgery

## 2020-10-17 ENCOUNTER — Ambulatory Visit (INDEPENDENT_AMBULATORY_CARE_PROVIDER_SITE_OTHER): Payer: Medicare Other

## 2020-10-17 ENCOUNTER — Ambulatory Visit (INDEPENDENT_AMBULATORY_CARE_PROVIDER_SITE_OTHER): Payer: Medicare Other | Admitting: Orthopaedic Surgery

## 2020-10-17 ENCOUNTER — Encounter: Payer: Self-pay | Admitting: Orthopaedic Surgery

## 2020-10-17 VITALS — Ht 64.0 in | Wt 229.0 lb

## 2020-10-17 DIAGNOSIS — Z96651 Presence of right artificial knee joint: Secondary | ICD-10-CM

## 2020-10-17 MED ORDER — MELOXICAM 7.5 MG PO TABS: 15.0000 mg | ORAL_TABLET | Freq: Every day | ORAL | 2 refills | Status: AC

## 2020-10-17 NOTE — Progress Notes (Signed)
Post-Op Visit Note   Patient: Jaime Solomon           Date of Birth: July 09, 1961           MRN: [id number] Visit Date: 10/17/2020 PCP: Shirline Frees, MD   Assessment & Plan:  Chief Complaint:  Chief Complaint  Patient presents with  . Right Knee - Follow-up    Right TKA 07/10/2020   Visit Diagnoses:  1. Hx of total knee replacement, right     Plan:   Jaime Solomon is 3 months status post uncemented right total knee replacement.  Overall doing well.  Recently has had some increased pain and swelling because she has been packing.  She is looking for new housing.  She denies any constitutional symptoms.  She has completed physical therapy.  Right knee shows a fully healed surgical scar.  Moderate joint effusion.  No signs of infection.  Range of motion is excellent.  X-rays show stable total knee replacement without complications.  Jaime Solomon is doing well for 3 months postop.  I think she has overdone it because she has been packing and moving out of her house.  Rest and ice anti-inflammatories.  Mobic was prescribed today.  Dental prophylaxis reinforced.  Recheck in 3 months with two-view x-rays of the right knee.   Follow-Up Instructions: Return in about 3 months (around 01/14/2021).   Orders:  Orders Placed This Encounter  Procedures  . XR Knee 1-2 Views Right   Meds ordered this encounter  Medications  . meloxicam (MOBIC) 7.5 MG tablet    Sig: Take 2 tablets (15 mg total) by mouth daily.    Dispense:  30 tablet    Refill:  2    Imaging: XR Knee 1-2 Views Right  Result Date: 10/17/2020 Stable total knee replacement in good alignment    PMFS History: Patient Active Problem List   Diagnosis Date Noted  . Primary osteoarthritis of right knee 07/10/2020  . Status post total knee replacement, right 07/10/2020  . Breast cancer ([facility]) 05/22/2005   Past Medical History:  Diagnosis Date  . Anxiety   . Breast cancer ([facility]) 2006  . Breast cancer ([facility]) 05/22/2012  . Cerebral  aneurysm without rupture 2010  . GERD (gastroesophageal reflux disease)   . Headache   . Personal history of radiation therapy     Family History  Problem Relation Age of Onset  . Diabetes Father   . Hypertension Father     Past Surgical History:  Procedure Laterality Date  . ANEURYSM COILING  2010   coiling and stenting for cerebral aneurysm  . angiography  2010   head  . BACK SURGERY  2020   L3-S1 Laminectomy and fusion - plate and 8 screws  . BREAST LUMPECTOMY Right 2006  . CHOLECYSTECTOMY    . MASTECTOMY PARTIAL / LUMPECTOMY  2006   right   . ORIF HUMERUS FRACTURE Left 06/21/2019   Procedure: OPEN REDUCTION INTERNAL FIXATION (ORIF) LEFT HUMERAL SHAFT FRACTURE;  Surgeon: Leandrew Koyanagi, MD;  Location: [facility];  Service: Orthopedics;  Laterality: Left;  . PARTIAL HYSTERECTOMY  1991  . ROTATOR CUFF REPAIR Right   . TOTAL KNEE ARTHROPLASTY Right 07/10/2020   Procedure: RIGHT TOTAL KNEE ARTHROPLASTY;  Surgeon: Leandrew Koyanagi, MD;  Location: [facility];  Service: Orthopedics;  Laterality: Right;   Social History   Occupational History  . Not on file  Tobacco Use  . Smoking status: Former Smoker    Quit date: 05/22/2005  Years since quitting: 15.4  . Smokeless tobacco: Never Used  Vaping Use  . Vaping Use: Never used  Substance and Sexual Activity  . Alcohol use: Yes    Alcohol/week: 2.0 standard drinks    Types: 2 Glasses of wine per week  . Drug use: No  . Sexual activity: Not Currently

## 2020-11-01 DIAGNOSIS — M4326 Fusion of spine, lumbar region: Secondary | ICD-10-CM | POA: Diagnosis not present

## 2020-11-01 DIAGNOSIS — M5106 Intervertebral disc disorders with myelopathy, lumbar region: Secondary | ICD-10-CM | POA: Diagnosis not present

## 2020-11-01 DIAGNOSIS — M7918 Myalgia, other site: Secondary | ICD-10-CM | POA: Diagnosis not present

## 2020-11-15 ENCOUNTER — Other Ambulatory Visit: Payer: Self-pay | Admitting: Orthopaedic Surgery

## 2020-11-15 DIAGNOSIS — M4326 Fusion of spine, lumbar region: Secondary | ICD-10-CM

## 2020-11-16 ENCOUNTER — Telehealth: Payer: Self-pay

## 2020-11-16 NOTE — Telephone Encounter (Signed)
Phone call to patient to verify medication list and allergies for myelogram procedure. Pt is on 4 medications that we would normally stop for the myelogram procedure, Wellbutrin, Maxalt, Celexa, and Nortriptyline. However, when speaking to the patient about this she was not willing to stop all four. I spoke to Dr. Jeralyn Ruths about this and he agreed to allow the patient to stop Wellbutrin and Maxalt and continue Celexa and Nortriptyline. Pt instructed to stop those medications for 48hrs prior to myelogram appointment time and 24 hours after appointment. Pt also instructed to have a driver the day of the procedure, the procedure would take around 2 hours, and discharge instructions discussed. Pt verbalized understanding.

## 2020-11-25 IMAGING — CR DG HUMERUS 2V *L*
2 series · 2 of 2 positions shown · non-contrast
Comparison: None.

CLINICAL DATA: Fall, pain

EXAM:
LEFT HUMERUS - 2+ VIEW; LEFT SHOULDER - 2+ VIEW

[w humerus ap left]
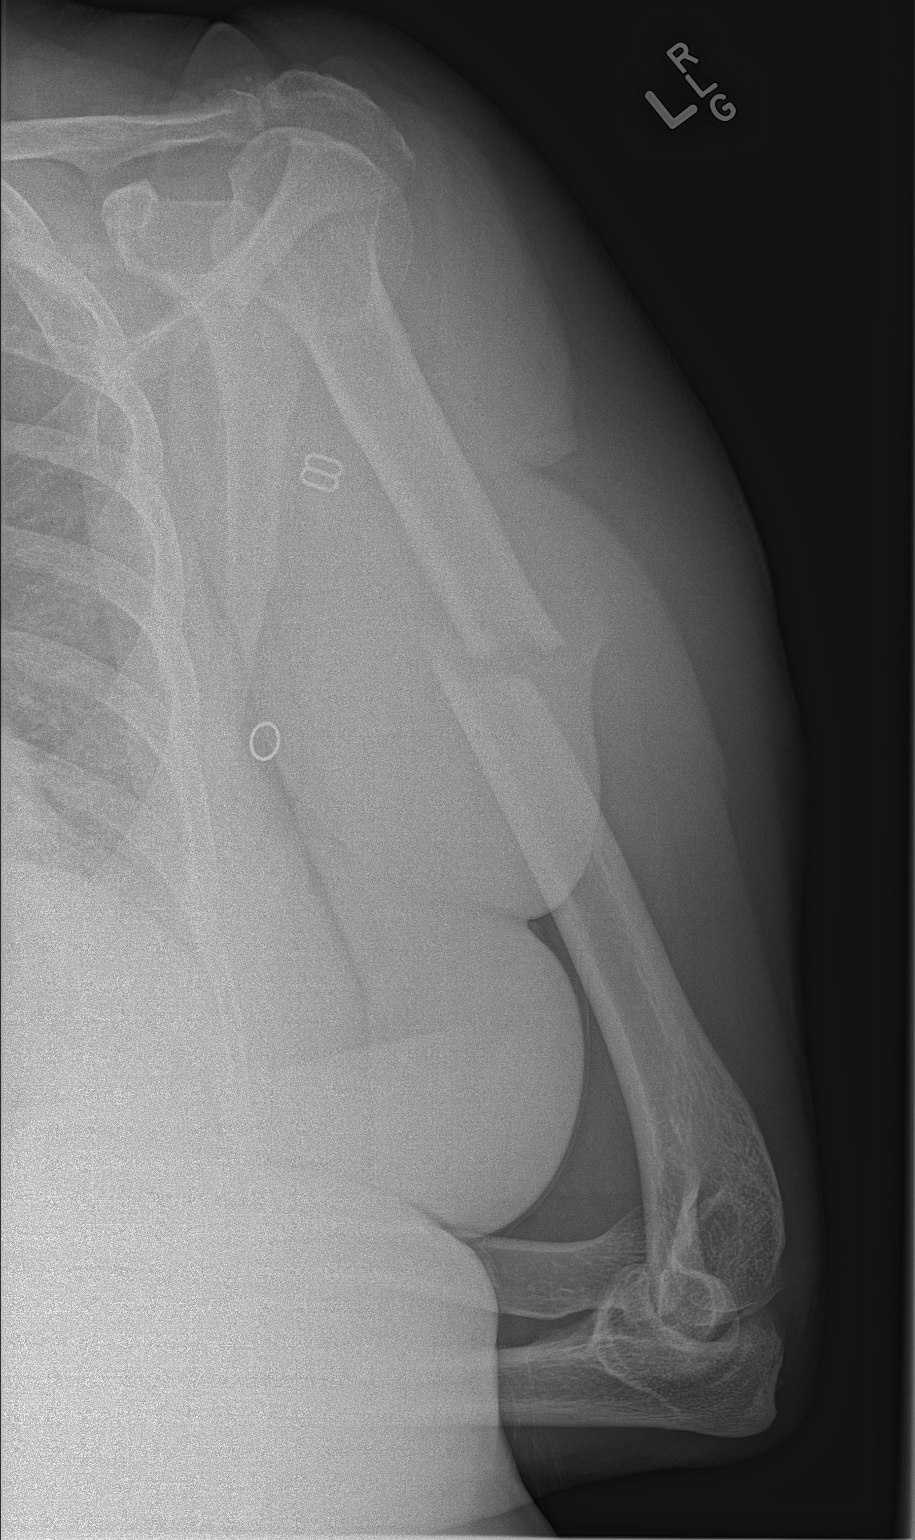

[w humerus lat left]
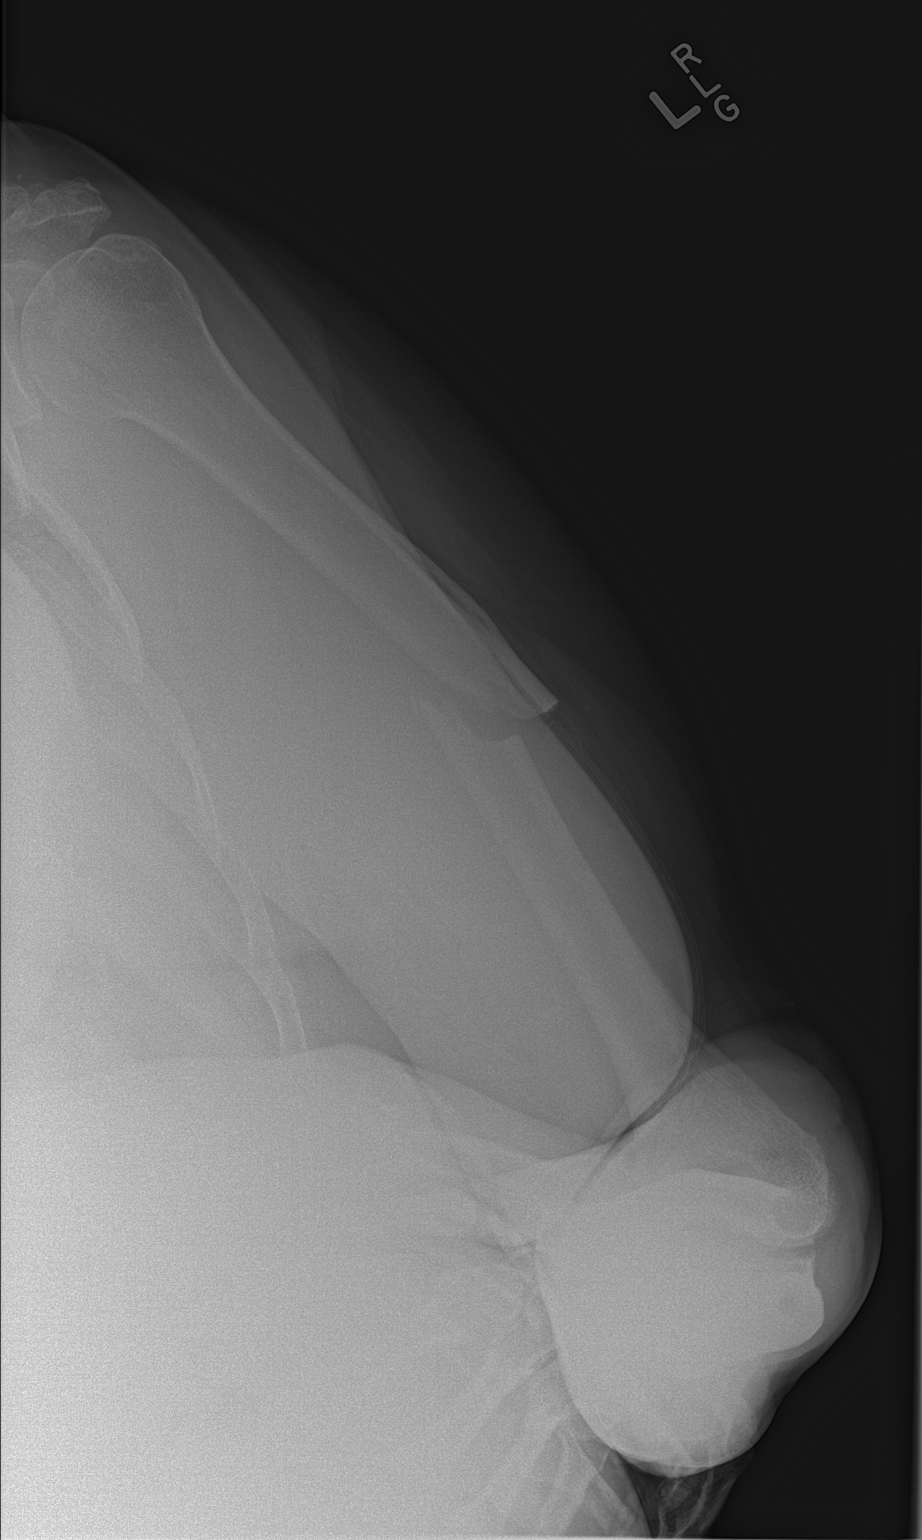

[2 of 2 positions shown; findings below may reference images not displayed]

FINDINGS: No fracture or dislocation of the left shoulder. There is moderate
left acromioclavicular and mild left glenohumeral arthrosis. The
partially imaged left chest is unremarkable.

There is an oblique, mildly distracted and angulated fracture of the
mid left humeral diaphysis. The distal humerus is intact.
IMPRESSION: 1.  No fracture or dislocation of the left shoulder.

2. There is an oblique, mildly distracted and angulated fracture of
the mid left humeral diaphysis. The distal humerus is intact.

## 2020-11-25 IMAGING — CR DG SHOULDER 2+V*L*
3 series · 3 of 3 positions shown · non-contrast
Comparison: None.

CLINICAL DATA: Fall, pain

EXAM:
LEFT HUMERUS - 2+ VIEW; LEFT SHOULDER - 2+ VIEW

[w shoulder internal left]
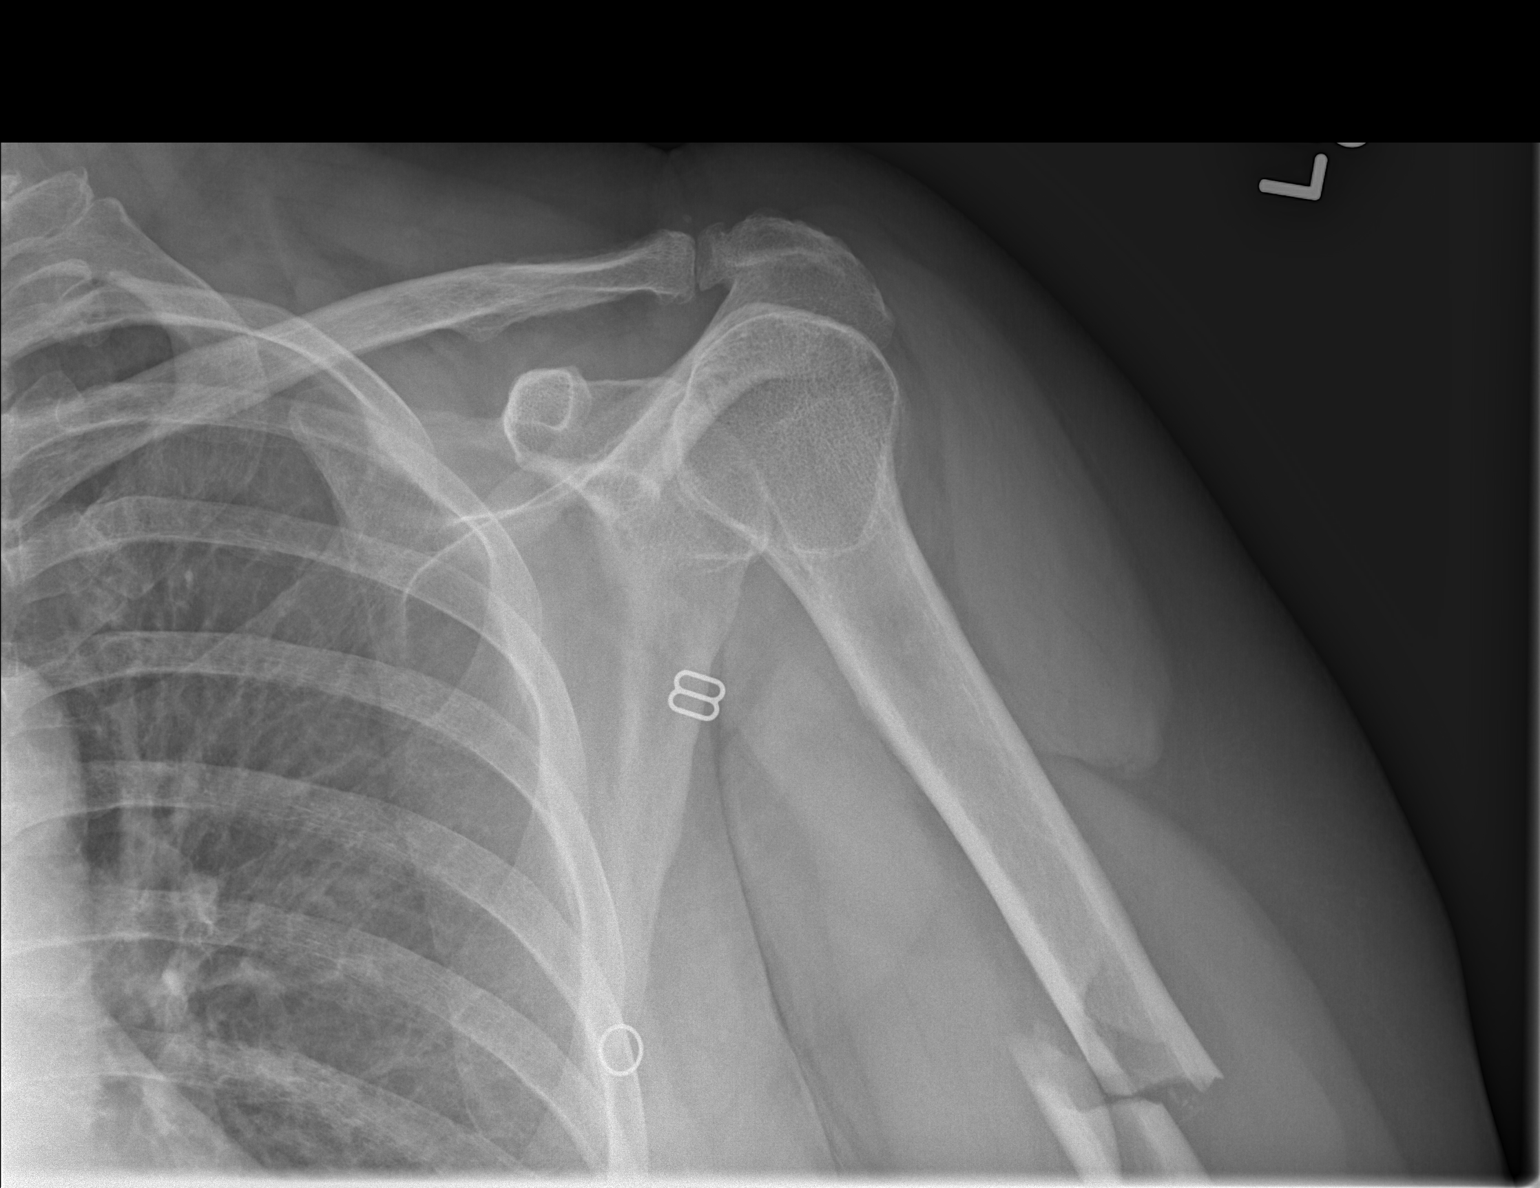

[w shoulder y-view left (1 of 2)]
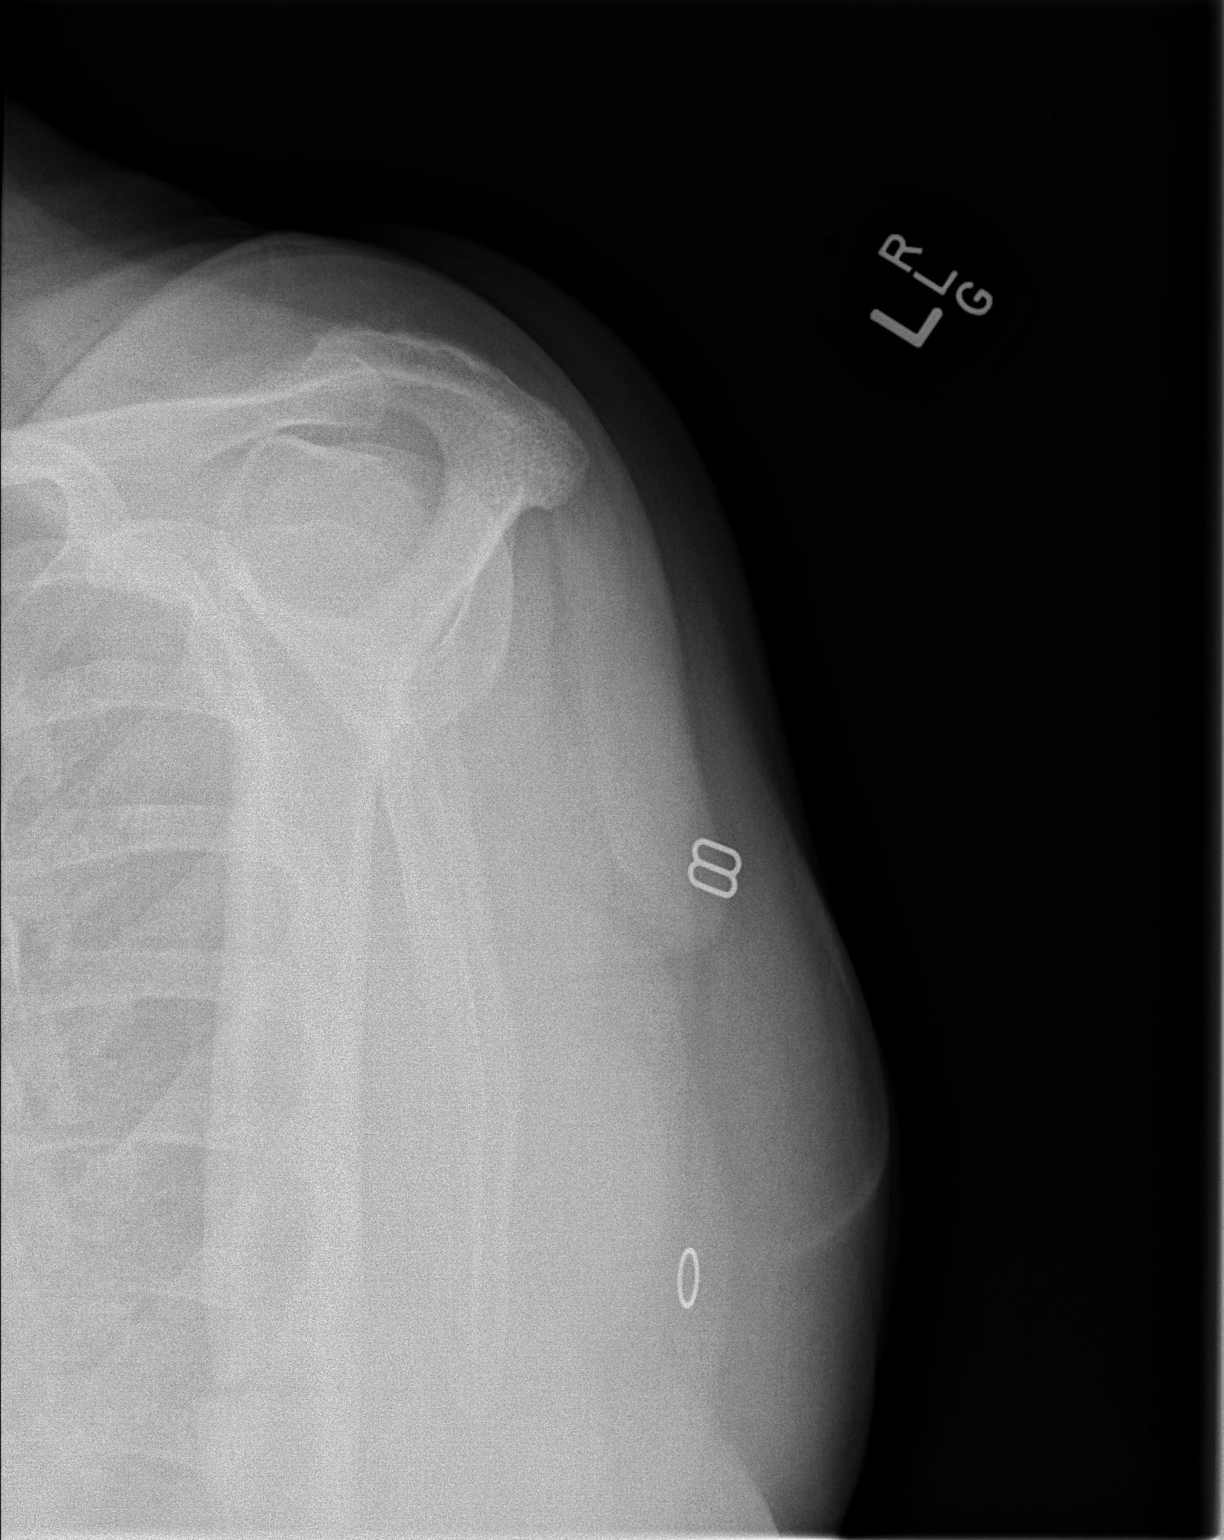

[w shoulder y-view left (2 of 2)]
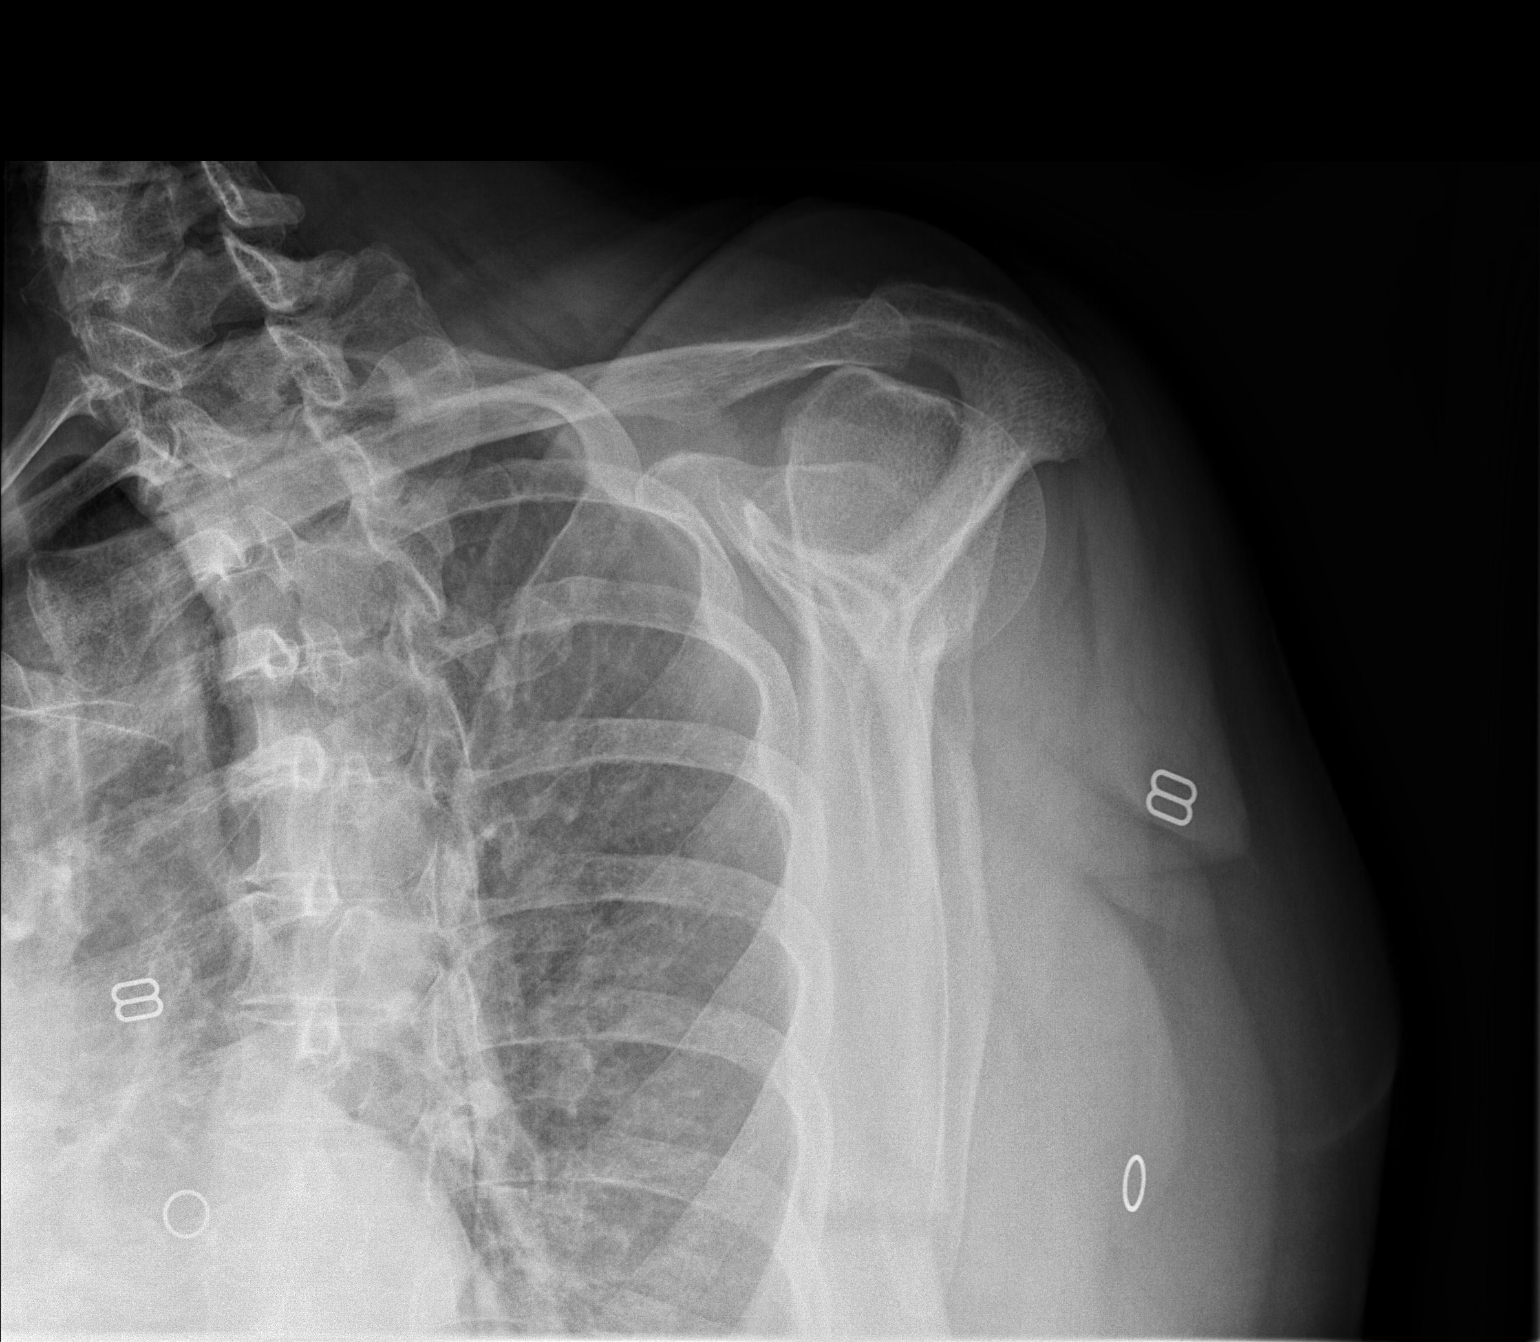

[3 of 3 positions shown; findings below may reference images not displayed]

FINDINGS: No fracture or dislocation of the left shoulder. There is moderate
left acromioclavicular and mild left glenohumeral arthrosis. The
partially imaged left chest is unremarkable.

There is an oblique, mildly distracted and angulated fracture of the
mid left humeral diaphysis. The distal humerus is intact.
IMPRESSION: 1.  No fracture or dislocation of the left shoulder.

2. There is an oblique, mildly distracted and angulated fracture of
the mid left humeral diaphysis. The distal humerus is intact.

## 2020-11-27 ENCOUNTER — Inpatient Hospital Stay: Admission: RE | Admit: 2020-11-27 | Payer: Medicare Other | Source: Ambulatory Visit

## 2020-11-27 ENCOUNTER — Other Ambulatory Visit: Payer: Medicare Other

## 2020-12-01 IMAGING — RF DG C-ARM 1-60 MIN
1 series · 2 of 2 positions shown · non-contrast
Comparison: 06/15/2019

CLINICAL DATA: Left humerus fracture.

EXAM:
LEFT HUMERUS - 2+ VIEW; DG C-ARM 1-60 MIN

[Series 1: run · 2 of 2 slices shown]
[im 1/2]
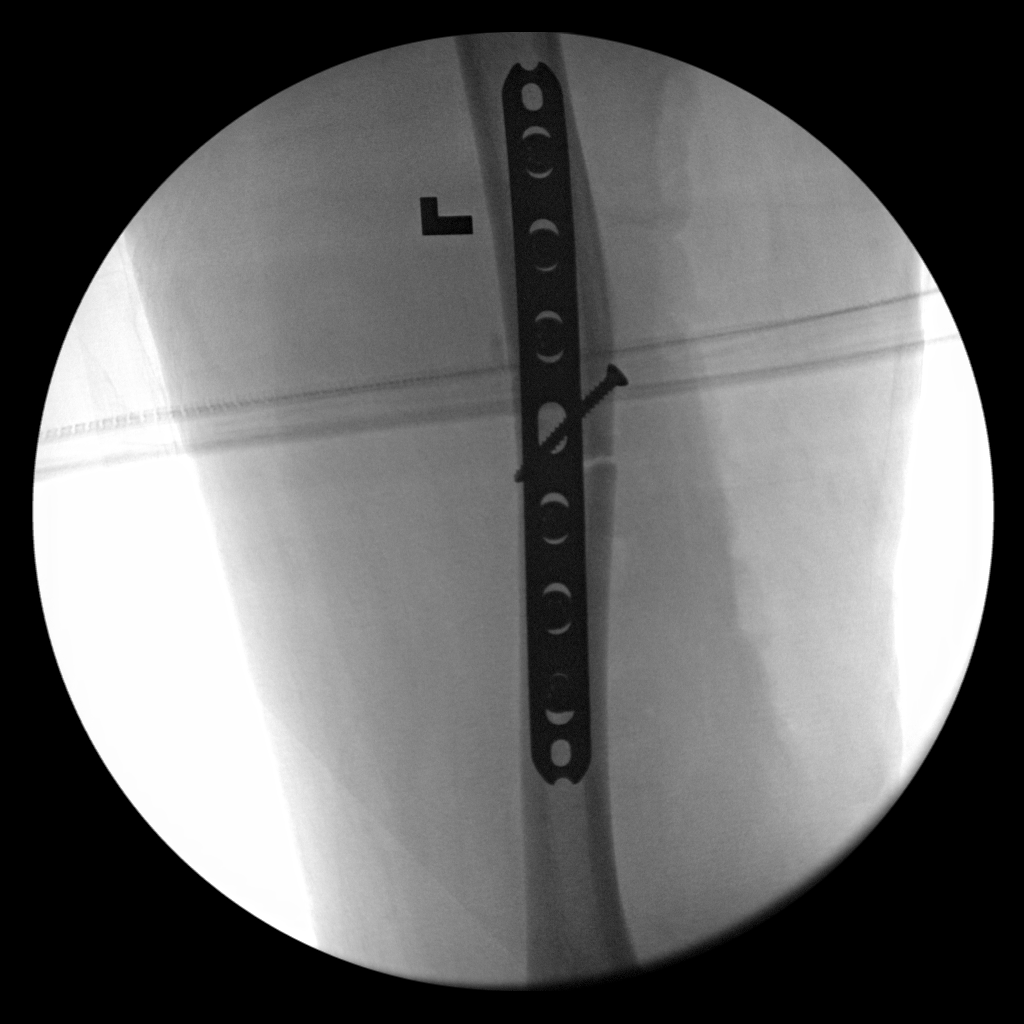
[im 2/2]
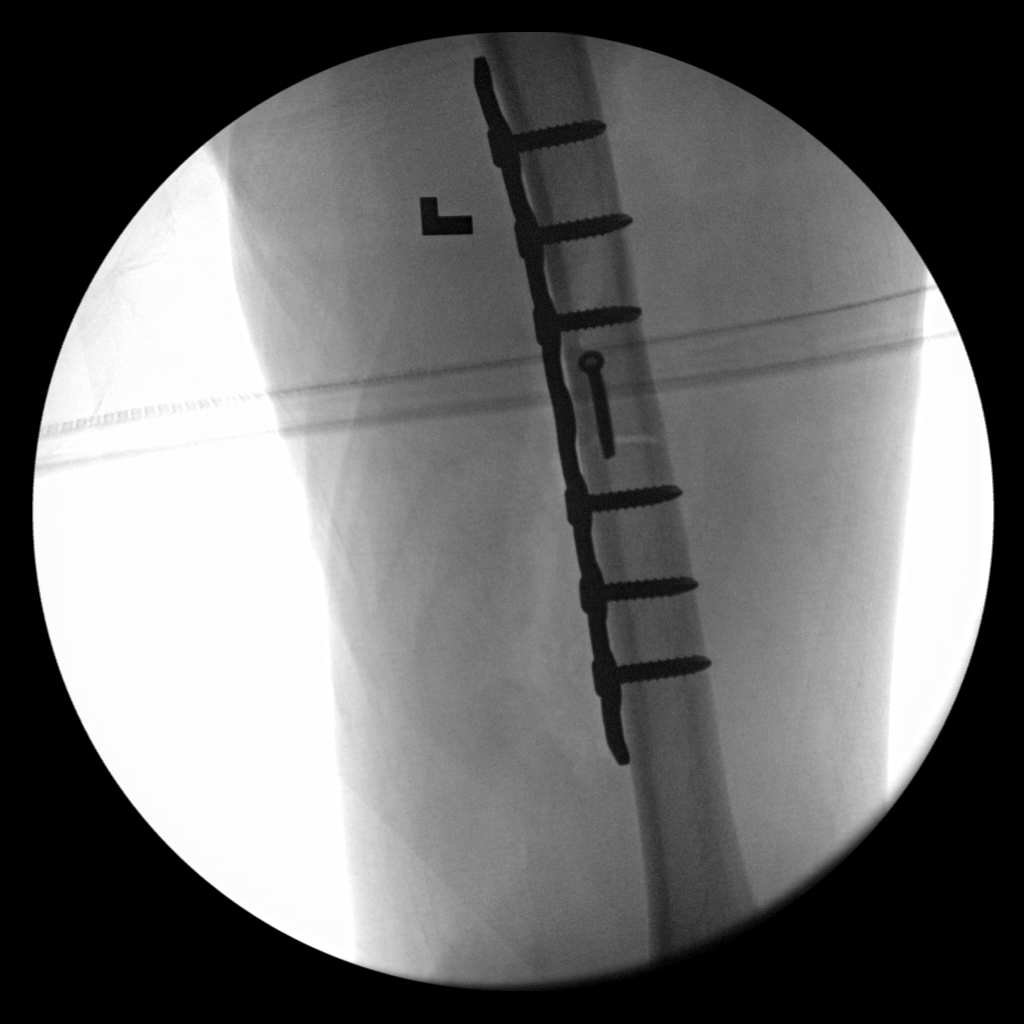

[2 of 2 positions shown; findings below may reference images not displayed]

FINDINGS: Intraoperative spot films show placement of fixation plate and
screws across a fracture of the humeral diaphysis, which is in near
anatomic alignment.
IMPRESSION: Internal fixation of humeral diaphyseal fracture in near anatomic
alignment.

## 2020-12-04 ENCOUNTER — Ambulatory Visit
Admission: RE | Admit: 2020-12-04 | Discharge: 2020-12-04 | Disposition: A | Discharge status: Home / Self Care | Payer: Medicare Other | Source: Ambulatory Visit | Attending: Orthopaedic Surgery | Admitting: Orthopaedic Surgery

## 2020-12-04 ENCOUNTER — Other Ambulatory Visit: Payer: Self-pay

## 2020-12-04 DIAGNOSIS — M5126 Other intervertebral disc displacement, lumbar region: Secondary | ICD-10-CM | POA: Diagnosis not present

## 2020-12-04 DIAGNOSIS — M4326 Fusion of spine, lumbar region: Secondary | ICD-10-CM

## 2020-12-04 DIAGNOSIS — M48061 Spinal stenosis, lumbar region without neurogenic claudication: Secondary | ICD-10-CM | POA: Diagnosis not present

## 2020-12-04 MED ORDER — MEPERIDINE HCL 50 MG/ML IJ SOLN
50.0000 mg | Freq: Once | INTRAMUSCULAR | Status: AC
  Administered 2020-12-04: 50 mg via INTRAMUSCULAR

## 2020-12-04 MED ORDER — ONDANSETRON HCL 4 MG/2ML IJ SOLN
4.0000 mg | Freq: Once | INTRAMUSCULAR | Status: AC
  Administered 2020-12-04: 4 mg via INTRAMUSCULAR

## 2020-12-04 MED ORDER — IOPAMIDOL (ISOVUE-M 200) INJECTION 41%
18.0000 mL | Freq: Once | INTRAMUSCULAR | Status: AC
  Administered 2020-12-04: 18 mL via INTRATHECAL

## 2020-12-04 MED ORDER — DIAZEPAM 5 MG PO TABS
10.0000 mg | ORAL_TABLET | Freq: Once | ORAL | Status: AC
  Administered 2020-12-04: 10 mg via ORAL

## 2020-12-04 NOTE — Discharge Instr - Other Orders (Signed)
1317 pt tearful; on fluoro table.  Procedure on hold while pain meds given.  *See MAR

## 2020-12-04 NOTE — Progress Notes (Signed)
Pt reports she has been off of Maxalt and Wellbutrin for 48 hours and verbalizes understanding not to restart this medication until tomorrow 12/05/20.

## 2020-12-04 NOTE — Discharge Instructions (Signed)
Myelogram Discharge Instructions  1. Go home and rest quietly for the next 24 hours.  It is important to lie flat for the next 24 hours.  Get up only to go to the restroom.  You may lie in the bed or on a couch on your back, your stomach, your left side or your right side.  You may have one pillow under your head.  You may have pillows between your knees while you are on your side or under your knees while you are on your back.  2. DO NOT drive today.  Recline the seat as far back as it will go, while still wearing your seat belt, on the way home.  3. You may get up to go to the bathroom as needed.  You may sit up for 10 minutes to eat.  You may resume your normal diet and medications unless otherwise indicated.  Drink lots of extra fluids today and tomorrow.  4. The incidence of headache, nausea, or vomiting is about 5% (one in 20 patients).  If you develop a headache, lie flat and drink plenty of fluids until the headache goes away.  Caffeinated beverages may be helpful.  If you develop severe nausea and vomiting or a headache that does not go away with flat bed rest, call [phone].  5. You may resume normal activities after your 24 hours of bed rest is over; however, do not exert yourself strongly or do any heavy lifting tomorrow. If when you get up you have a headache when standing, go back to bed and force fluids for another 24 hours.  6. Call your physician for a follow-up appointment.  The results of your myelogram will be sent directly to your physician by the following day.  7. If you have any questions or if complications develop after you arrive home, please call [phone].  Discharge instructions have been explained to the patient.  The patient, or the person responsible for the patient, fully understands these instructions   YOU MAY [place] ON 12/05/20 @ 1PM OR THEREAFTER.

## 2020-12-07 DIAGNOSIS — G894 Chronic pain syndrome: Secondary | ICD-10-CM | POA: Diagnosis not present

## 2020-12-07 DIAGNOSIS — M7918 Myalgia, other site: Secondary | ICD-10-CM | POA: Diagnosis not present

## 2020-12-07 DIAGNOSIS — M5106 Intervertebral disc disorders with myelopathy, lumbar region: Secondary | ICD-10-CM | POA: Diagnosis not present

## 2020-12-07 DIAGNOSIS — Z79891 Long term (current) use of opiate analgesic: Secondary | ICD-10-CM | POA: Diagnosis not present

## 2020-12-07 DIAGNOSIS — M4326 Fusion of spine, lumbar region: Secondary | ICD-10-CM | POA: Diagnosis not present

## 2020-12-07 DIAGNOSIS — Z79899 Other long term (current) drug therapy: Secondary | ICD-10-CM | POA: Diagnosis not present

## 2020-12-19 DIAGNOSIS — M48062 Spinal stenosis, lumbar region with neurogenic claudication: Secondary | ICD-10-CM | POA: Diagnosis not present

## 2021-01-04 DIAGNOSIS — M4150 Other secondary scoliosis, site unspecified: Secondary | ICD-10-CM | POA: Diagnosis not present

## 2021-01-04 DIAGNOSIS — M4716 Other spondylosis with myelopathy, lumbar region: Secondary | ICD-10-CM | POA: Diagnosis not present

## 2021-01-04 DIAGNOSIS — M48062 Spinal stenosis, lumbar region with neurogenic claudication: Secondary | ICD-10-CM | POA: Diagnosis not present

## 2021-01-04 DIAGNOSIS — M5106 Intervertebral disc disorders with myelopathy, lumbar region: Secondary | ICD-10-CM | POA: Diagnosis not present

## 2021-01-08 ENCOUNTER — Encounter: Payer: Self-pay | Admitting: Orthopaedic Surgery

## 2021-01-09 ENCOUNTER — Ambulatory Visit: Payer: Medicare Other | Admitting: Orthopaedic Surgery

## 2021-01-09 DIAGNOSIS — G8929 Other chronic pain: Secondary | ICD-10-CM | POA: Diagnosis not present

## 2021-01-09 DIAGNOSIS — M5441 Lumbago with sciatica, right side: Secondary | ICD-10-CM | POA: Diagnosis not present

## 2021-01-16 ENCOUNTER — Ambulatory Visit: Payer: Medicare Other | Admitting: Orthopaedic Surgery

## 2021-01-16 ENCOUNTER — Other Ambulatory Visit: Payer: Self-pay

## 2021-01-16 ENCOUNTER — Encounter: Payer: Self-pay | Admitting: Orthopaedic Surgery

## 2021-01-16 ENCOUNTER — Ambulatory Visit: Payer: Self-pay

## 2021-01-16 DIAGNOSIS — Z96651 Presence of right artificial knee joint: Secondary | ICD-10-CM

## 2021-01-16 NOTE — Progress Notes (Signed)
Post-Op Visit Note   Patient: Jaime Solomon           Date of Birth: 24-Apr-1961           MRN: [id number] Visit Date: 01/16/2021 PCP: Shirline Frees, MD   Assessment & Plan:  Chief Complaint:  Chief Complaint  Patient presents with  . Right Knee - Pain   Visit Diagnoses:  1. Hx of total knee replacement, right     Plan: Patient is a very pleasant 60 year old female who comes in today approximately 6 months out right total knee replacement.  She has been doing fairly well.  She still has some soreness and swelling but this has improved over the past few months.  She notes that this is likely still contributed to unpacking boxes as she has recently moved.  She has also been noticing some numbness into her feet but notes that she has a herniated disc at L2-3 with previous fusion L4-5 and is scheduled to undergo another back surgery in the coming weeks.  She is taking Norco primarily for her back.  Examination of her right knee reveals a fully healed surgical scar without complication.  Range of motion 0 to about 125 degrees.  Stable to valgus varus stress.  She is neurovascularly intact distally.  At this point, she will continue to advance with activity as tolerated.  She will continue to ice, elevate back off activity as needed due to the pain and swelling.  I believe the numbness is likely coming from her back and will hopefully improve after her surgery.  She will follow-up with [place] in 3 months time for recheck.  Dental prophylaxis reinforced.  Call with concerns or questions.  Follow-Up Instructions: Return in about 3 months (around 04/18/2021).   Orders:  Orders Placed This Encounter  Procedures  . XR Knee 1-2 Views Right   No orders of the defined types were placed in this encounter.   Imaging: XR Knee 1-2 Views Right  Result Date: 01/16/2021 X-rays demonstrate a well-seated prosthesis without complication   PMFS History: Patient Active Problem List   Diagnosis Date Noted   . Primary osteoarthritis of right knee 07/10/2020  . Status post total knee replacement, right 07/10/2020  . Breast cancer ([place]) 05/22/2005   Past Medical History:  Diagnosis Date  . Anxiety   . Breast cancer ([place]) 2006  . Breast cancer ([place]) 05/22/2012  . Cerebral aneurysm without rupture 2010  . GERD (gastroesophageal reflux disease)   . Headache   . Personal history of radiation therapy     Family History  Problem Relation Age of Onset  . Diabetes Father   . Hypertension Father     Past Surgical History:  Procedure Laterality Date  . ANEURYSM COILING  2010   coiling and stenting for cerebral aneurysm  . angiography  2010   head  . BACK SURGERY  2020   L3-S1 Laminectomy and fusion - plate and 8 screws  . BREAST LUMPECTOMY Right 2006  . CHOLECYSTECTOMY    . MASTECTOMY PARTIAL / LUMPECTOMY  2006   right   . ORIF HUMERUS FRACTURE Left 06/21/2019   Procedure: OPEN REDUCTION INTERNAL FIXATION (ORIF) LEFT HUMERAL SHAFT FRACTURE;  Surgeon: Leandrew Koyanagi, MD;  Location: [facility];  Service: Orthopedics;  Laterality: Left;  . PARTIAL HYSTERECTOMY  1991  . ROTATOR CUFF REPAIR Right   . TOTAL KNEE ARTHROPLASTY Right 07/10/2020   Procedure: RIGHT TOTAL KNEE ARTHROPLASTY;  Surgeon: Leandrew Koyanagi, MD;  Location: [facility];  Service: Orthopedics;  Laterality: Right;   Social History   Occupational History  . Not on file  Tobacco Use  . Smoking status: Former Smoker    Quit date: 05/22/2005    Years since quitting: 15.6  . Smokeless tobacco: Never Used  Vaping Use  . Vaping Use: Never used  Substance and Sexual Activity  . Alcohol use: Yes    Alcohol/week: 2.0 standard drinks    Types: 2 Glasses of wine per week  . Drug use: No  . Sexual activity: Not Currently

## 2021-01-26 DIAGNOSIS — L239 Allergic contact dermatitis, unspecified cause: Secondary | ICD-10-CM | POA: Diagnosis not present

## 2021-02-01 DIAGNOSIS — L239 Allergic contact dermatitis, unspecified cause: Secondary | ICD-10-CM | POA: Diagnosis not present

## 2021-03-02 DIAGNOSIS — M48062 Spinal stenosis, lumbar region with neurogenic claudication: Secondary | ICD-10-CM | POA: Diagnosis not present

## 2021-03-02 DIAGNOSIS — M4326 Fusion of spine, lumbar region: Secondary | ICD-10-CM | POA: Diagnosis not present

## 2021-03-02 DIAGNOSIS — Z01810 Encounter for preprocedural cardiovascular examination: Secondary | ICD-10-CM | POA: Diagnosis not present

## 2021-03-02 DIAGNOSIS — Z981 Arthrodesis status: Secondary | ICD-10-CM | POA: Diagnosis not present

## 2021-03-02 DIAGNOSIS — M4150 Other secondary scoliosis, site unspecified: Secondary | ICD-10-CM | POA: Diagnosis not present

## 2021-03-02 DIAGNOSIS — M5106 Intervertebral disc disorders with myelopathy, lumbar region: Secondary | ICD-10-CM | POA: Diagnosis not present

## 2021-03-02 DIAGNOSIS — M4716 Other spondylosis with myelopathy, lumbar region: Secondary | ICD-10-CM | POA: Diagnosis not present

## 2021-03-02 DIAGNOSIS — Z01812 Encounter for preprocedural laboratory examination: Secondary | ICD-10-CM | POA: Diagnosis not present

## 2021-03-03 DIAGNOSIS — I252 Old myocardial infarction: Secondary | ICD-10-CM | POA: Diagnosis not present

## 2021-03-19 DIAGNOSIS — M48062 Spinal stenosis, lumbar region with neurogenic claudication: Secondary | ICD-10-CM | POA: Diagnosis not present

## 2021-03-19 DIAGNOSIS — M419 Scoliosis, unspecified: Secondary | ICD-10-CM | POA: Diagnosis not present

## 2021-03-19 DIAGNOSIS — M5116 Intervertebral disc disorders with radiculopathy, lumbar region: Secondary | ICD-10-CM | POA: Diagnosis not present

## 2021-03-19 DIAGNOSIS — M545 Low back pain, unspecified: Secondary | ICD-10-CM | POA: Diagnosis not present

## 2021-03-19 DIAGNOSIS — M961 Postlaminectomy syndrome, not elsewhere classified: Secondary | ICD-10-CM | POA: Diagnosis not present

## 2021-03-19 DIAGNOSIS — M7138 Other bursal cyst, other site: Secondary | ICD-10-CM | POA: Diagnosis not present

## 2021-03-19 DIAGNOSIS — M4316 Spondylolisthesis, lumbar region: Secondary | ICD-10-CM | POA: Diagnosis not present

## 2021-03-19 DIAGNOSIS — M4150 Other secondary scoliosis, site unspecified: Secondary | ICD-10-CM | POA: Diagnosis not present

## 2021-03-19 DIAGNOSIS — M5106 Intervertebral disc disorders with myelopathy, lumbar region: Secondary | ICD-10-CM | POA: Diagnosis not present

## 2021-03-19 DIAGNOSIS — M4716 Other spondylosis with myelopathy, lumbar region: Secondary | ICD-10-CM | POA: Diagnosis not present

## 2021-03-19 DIAGNOSIS — Z981 Arthrodesis status: Secondary | ICD-10-CM | POA: Diagnosis not present

## 2021-03-19 DIAGNOSIS — M4326 Fusion of spine, lumbar region: Secondary | ICD-10-CM | POA: Diagnosis not present

## 2021-03-20 DIAGNOSIS — M545 Low back pain, unspecified: Secondary | ICD-10-CM | POA: Diagnosis not present

## 2021-03-20 DIAGNOSIS — M4316 Spondylolisthesis, lumbar region: Secondary | ICD-10-CM | POA: Diagnosis not present

## 2021-03-20 DIAGNOSIS — M419 Scoliosis, unspecified: Secondary | ICD-10-CM | POA: Diagnosis not present

## 2021-03-20 DIAGNOSIS — M48062 Spinal stenosis, lumbar region with neurogenic claudication: Secondary | ICD-10-CM | POA: Diagnosis not present

## 2021-03-20 DIAGNOSIS — M4326 Fusion of spine, lumbar region: Secondary | ICD-10-CM | POA: Diagnosis not present

## 2021-03-20 DIAGNOSIS — Z981 Arthrodesis status: Secondary | ICD-10-CM | POA: Diagnosis not present

## 2021-03-20 DIAGNOSIS — M5116 Intervertebral disc disorders with radiculopathy, lumbar region: Secondary | ICD-10-CM | POA: Diagnosis not present

## 2021-03-20 DIAGNOSIS — M7138 Other bursal cyst, other site: Secondary | ICD-10-CM | POA: Diagnosis not present

## 2021-03-21 DIAGNOSIS — M419 Scoliosis, unspecified: Secondary | ICD-10-CM | POA: Diagnosis not present

## 2021-03-21 DIAGNOSIS — M545 Low back pain, unspecified: Secondary | ICD-10-CM | POA: Diagnosis not present

## 2021-03-21 DIAGNOSIS — M5116 Intervertebral disc disorders with radiculopathy, lumbar region: Secondary | ICD-10-CM | POA: Diagnosis not present

## 2021-03-21 DIAGNOSIS — M7138 Other bursal cyst, other site: Secondary | ICD-10-CM | POA: Diagnosis not present

## 2021-03-21 DIAGNOSIS — M48062 Spinal stenosis, lumbar region with neurogenic claudication: Secondary | ICD-10-CM | POA: Diagnosis not present

## 2021-03-21 DIAGNOSIS — M4316 Spondylolisthesis, lumbar region: Secondary | ICD-10-CM | POA: Diagnosis not present

## 2021-03-21 DIAGNOSIS — Z981 Arthrodesis status: Secondary | ICD-10-CM | POA: Diagnosis not present

## 2021-03-28 DIAGNOSIS — M4150 Other secondary scoliosis, site unspecified: Secondary | ICD-10-CM | POA: Diagnosis not present

## 2021-03-28 DIAGNOSIS — Z4789 Encounter for other orthopedic aftercare: Secondary | ICD-10-CM | POA: Diagnosis not present

## 2021-03-28 DIAGNOSIS — R2681 Unsteadiness on feet: Secondary | ICD-10-CM | POA: Diagnosis not present

## 2021-03-28 DIAGNOSIS — M5136 Other intervertebral disc degeneration, lumbar region: Secondary | ICD-10-CM | POA: Diagnosis not present

## 2021-03-28 DIAGNOSIS — M6281 Muscle weakness (generalized): Secondary | ICD-10-CM | POA: Diagnosis not present

## 2021-04-03 DIAGNOSIS — M6281 Muscle weakness (generalized): Secondary | ICD-10-CM | POA: Diagnosis not present

## 2021-04-03 DIAGNOSIS — M4150 Other secondary scoliosis, site unspecified: Secondary | ICD-10-CM | POA: Diagnosis not present

## 2021-04-03 DIAGNOSIS — M5136 Other intervertebral disc degeneration, lumbar region: Secondary | ICD-10-CM | POA: Diagnosis not present

## 2021-04-03 DIAGNOSIS — R2681 Unsteadiness on feet: Secondary | ICD-10-CM | POA: Diagnosis not present

## 2021-04-03 DIAGNOSIS — Z4789 Encounter for other orthopedic aftercare: Secondary | ICD-10-CM | POA: Diagnosis not present

## 2021-04-09 DIAGNOSIS — M6281 Muscle weakness (generalized): Secondary | ICD-10-CM | POA: Diagnosis not present

## 2021-04-09 DIAGNOSIS — M5136 Other intervertebral disc degeneration, lumbar region: Secondary | ICD-10-CM | POA: Diagnosis not present

## 2021-04-09 DIAGNOSIS — R2681 Unsteadiness on feet: Secondary | ICD-10-CM | POA: Diagnosis not present

## 2021-04-09 DIAGNOSIS — Z4789 Encounter for other orthopedic aftercare: Secondary | ICD-10-CM | POA: Diagnosis not present

## 2021-04-09 DIAGNOSIS — M4150 Other secondary scoliosis, site unspecified: Secondary | ICD-10-CM | POA: Diagnosis not present

## 2021-04-11 DIAGNOSIS — M6281 Muscle weakness (generalized): Secondary | ICD-10-CM | POA: Diagnosis not present

## 2021-04-11 DIAGNOSIS — M4150 Other secondary scoliosis, site unspecified: Secondary | ICD-10-CM | POA: Diagnosis not present

## 2021-04-11 DIAGNOSIS — Z4789 Encounter for other orthopedic aftercare: Secondary | ICD-10-CM | POA: Diagnosis not present

## 2021-04-11 DIAGNOSIS — R2681 Unsteadiness on feet: Secondary | ICD-10-CM | POA: Diagnosis not present

## 2021-04-11 DIAGNOSIS — M5136 Other intervertebral disc degeneration, lumbar region: Secondary | ICD-10-CM | POA: Diagnosis not present

## 2021-04-12 DIAGNOSIS — Z4789 Encounter for other orthopedic aftercare: Secondary | ICD-10-CM | POA: Diagnosis not present

## 2021-04-12 DIAGNOSIS — R2681 Unsteadiness on feet: Secondary | ICD-10-CM | POA: Diagnosis not present

## 2021-04-12 DIAGNOSIS — M5136 Other intervertebral disc degeneration, lumbar region: Secondary | ICD-10-CM | POA: Diagnosis not present

## 2021-04-12 DIAGNOSIS — M6281 Muscle weakness (generalized): Secondary | ICD-10-CM | POA: Diagnosis not present

## 2021-04-12 DIAGNOSIS — M4150 Other secondary scoliosis, site unspecified: Secondary | ICD-10-CM | POA: Diagnosis not present

## 2021-04-17 DIAGNOSIS — M4326 Fusion of spine, lumbar region: Secondary | ICD-10-CM | POA: Diagnosis not present

## 2021-04-18 DIAGNOSIS — R2681 Unsteadiness on feet: Secondary | ICD-10-CM | POA: Diagnosis not present

## 2021-04-18 DIAGNOSIS — Z4789 Encounter for other orthopedic aftercare: Secondary | ICD-10-CM | POA: Diagnosis not present

## 2021-04-18 DIAGNOSIS — M5136 Other intervertebral disc degeneration, lumbar region: Secondary | ICD-10-CM | POA: Diagnosis not present

## 2021-04-18 DIAGNOSIS — M6281 Muscle weakness (generalized): Secondary | ICD-10-CM | POA: Diagnosis not present

## 2021-04-18 DIAGNOSIS — M4150 Other secondary scoliosis, site unspecified: Secondary | ICD-10-CM | POA: Diagnosis not present

## 2021-05-01 DIAGNOSIS — M4326 Fusion of spine, lumbar region: Secondary | ICD-10-CM | POA: Diagnosis not present

## 2021-06-13 DIAGNOSIS — M4326 Fusion of spine, lumbar region: Secondary | ICD-10-CM | POA: Diagnosis not present

## 2021-07-12 DIAGNOSIS — M5106 Intervertebral disc disorders with myelopathy, lumbar region: Secondary | ICD-10-CM | POA: Diagnosis not present

## 2021-07-12 DIAGNOSIS — M4716 Other spondylosis with myelopathy, lumbar region: Secondary | ICD-10-CM | POA: Diagnosis not present

## 2021-07-12 DIAGNOSIS — M4326 Fusion of spine, lumbar region: Secondary | ICD-10-CM | POA: Diagnosis not present

## 2021-07-13 ENCOUNTER — Other Ambulatory Visit: Payer: Self-pay | Admitting: Orthopaedic Surgery

## 2021-07-13 DIAGNOSIS — M4326 Fusion of spine, lumbar region: Secondary | ICD-10-CM

## 2021-07-19 ENCOUNTER — Other Ambulatory Visit: Payer: Medicare Other

## 2021-09-04 ENCOUNTER — Other Ambulatory Visit: Payer: Medicare Other

## 2021-09-13 ENCOUNTER — Other Ambulatory Visit: Payer: Medicare Other

## 2021-10-29 DIAGNOSIS — K219 Gastro-esophageal reflux disease without esophagitis: Secondary | ICD-10-CM | POA: Diagnosis not present

## 2021-10-29 DIAGNOSIS — Z136 Encounter for screening for cardiovascular disorders: Secondary | ICD-10-CM | POA: Diagnosis not present

## 2021-10-29 DIAGNOSIS — L299 Pruritus, unspecified: Secondary | ICD-10-CM | POA: Diagnosis not present

## 2021-10-29 DIAGNOSIS — Z853 Personal history of malignant neoplasm of breast: Secondary | ICD-10-CM | POA: Diagnosis not present

## 2021-10-29 DIAGNOSIS — G8929 Other chronic pain: Secondary | ICD-10-CM | POA: Diagnosis not present

## 2021-10-29 DIAGNOSIS — G4459 Other complicated headache syndrome: Secondary | ICD-10-CM | POA: Diagnosis not present

## 2021-10-29 DIAGNOSIS — B0229 Other postherpetic nervous system involvement: Secondary | ICD-10-CM | POA: Diagnosis not present

## 2021-10-29 DIAGNOSIS — Z13220 Encounter for screening for lipoid disorders: Secondary | ICD-10-CM | POA: Diagnosis not present
